# Patient Record
Sex: Female | Born: 1950 | Race: White | Hispanic: No | Marital: Single | Smoking: Never smoker
Health system: Southern US, Community
[De-identification: ages and names within clinical notes are randomized; demographics above are authoritative.]

---

## 1998-05-17 ENCOUNTER — Other Ambulatory Visit: Admission: RE | Admit: 1998-05-17 | Discharge: 1998-05-17 | Payer: Self-pay | Admitting: Obstetrics and Gynecology

## 1998-10-28 ENCOUNTER — Other Ambulatory Visit: Admission: RE | Admit: 1998-10-28 | Discharge: 1998-10-28 | Payer: Self-pay | Admitting: Obstetrics and Gynecology

## 1999-06-29 ENCOUNTER — Other Ambulatory Visit: Admission: RE | Admit: 1999-06-29 | Discharge: 1999-06-29 | Payer: Self-pay | Admitting: *Deleted

## 2000-07-01 ENCOUNTER — Other Ambulatory Visit: Admission: RE | Admit: 2000-07-01 | Discharge: 2000-07-01 | Payer: Self-pay | Admitting: *Deleted

## 2001-05-05 ENCOUNTER — Other Ambulatory Visit: Admission: RE | Admit: 2001-05-05 | Discharge: 2001-05-05 | Payer: Self-pay | Admitting: Internal Medicine

## 2001-05-05 ENCOUNTER — Encounter: Admission: RE | Admit: 2001-05-05 | Discharge: 2001-05-05 | Payer: Self-pay | Admitting: Internal Medicine

## 2001-05-05 ENCOUNTER — Encounter: Payer: Self-pay | Admitting: Internal Medicine

## 2002-07-28 ENCOUNTER — Other Ambulatory Visit: Admission: RE | Admit: 2002-07-28 | Discharge: 2002-07-28 | Payer: Self-pay | Admitting: Internal Medicine

## 2003-03-24 ENCOUNTER — Encounter: Admission: RE | Admit: 2003-03-24 | Discharge: 2003-03-24 | Payer: Self-pay | Admitting: Internal Medicine

## 2004-11-27 ENCOUNTER — Other Ambulatory Visit: Admission: RE | Admit: 2004-11-27 | Discharge: 2004-11-27 | Payer: Self-pay | Admitting: Internal Medicine

## 2005-06-29 IMAGING — CR DG CERVICAL SPINE COMPLETE 4+V
5 series · 5 of 5 positions shown · non-contrast
Comparison: none

CLINICAL DATA: Neck pain.

[view not recorded (1 of 5)]
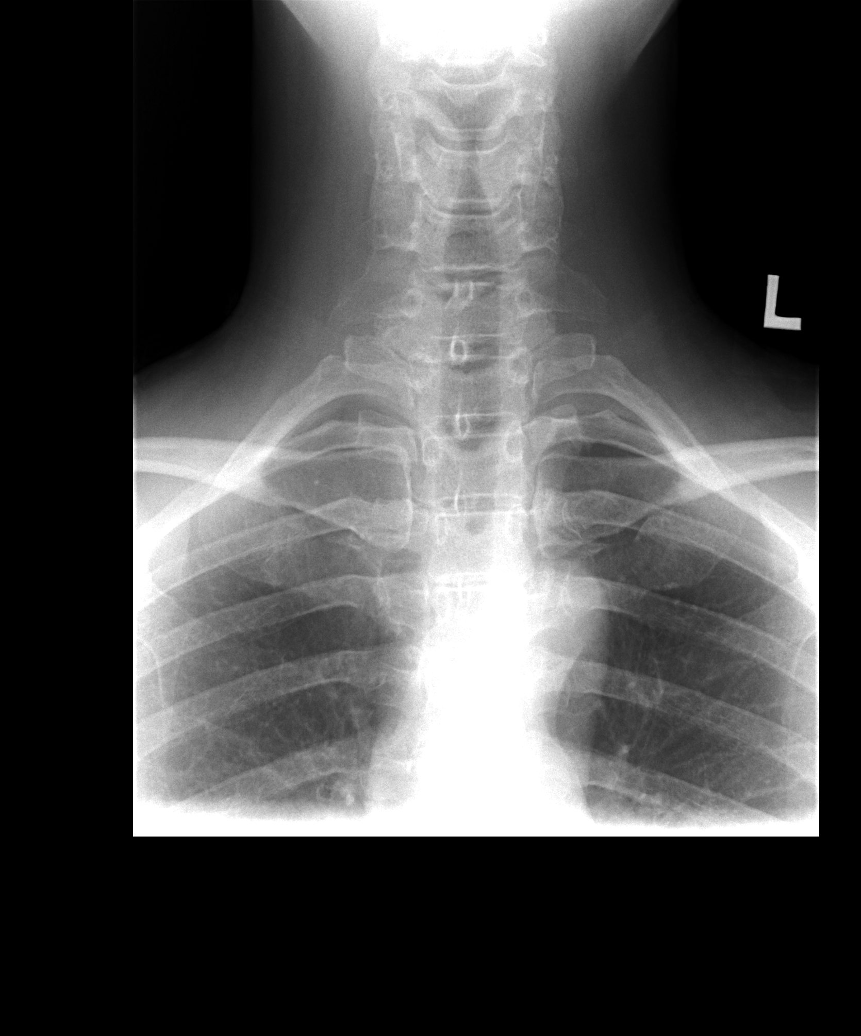

[view not recorded (2 of 5)]
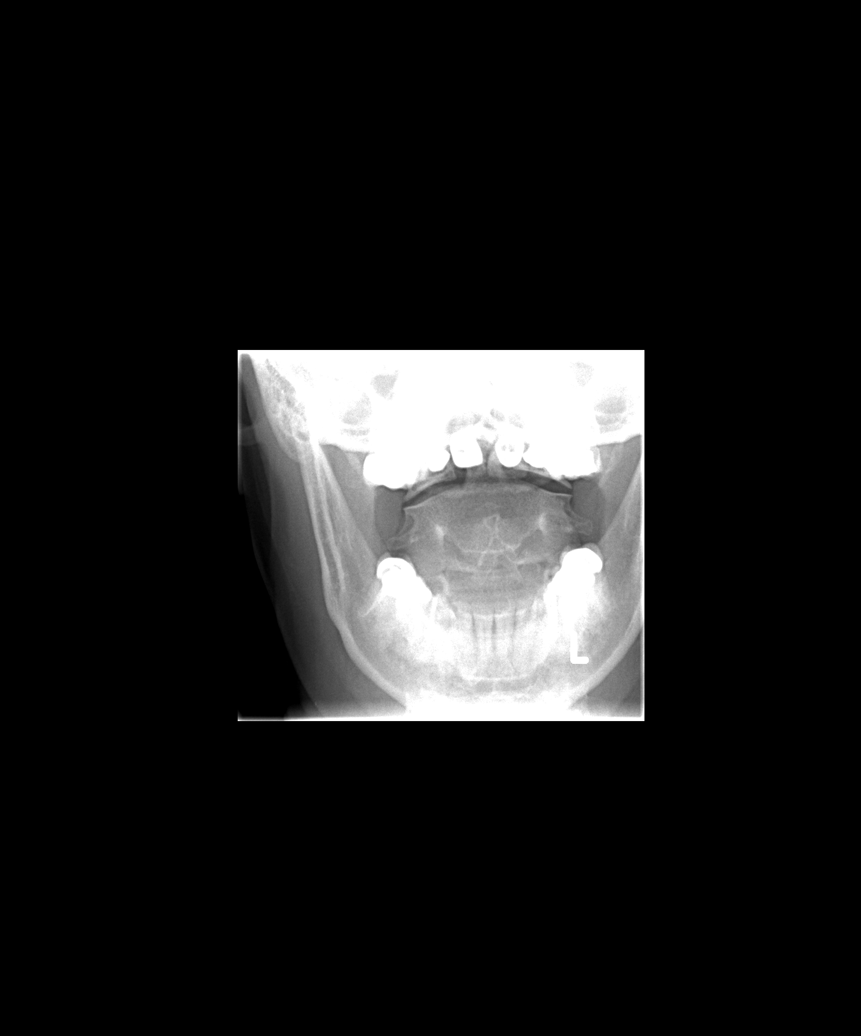

[view not recorded (3 of 5)]
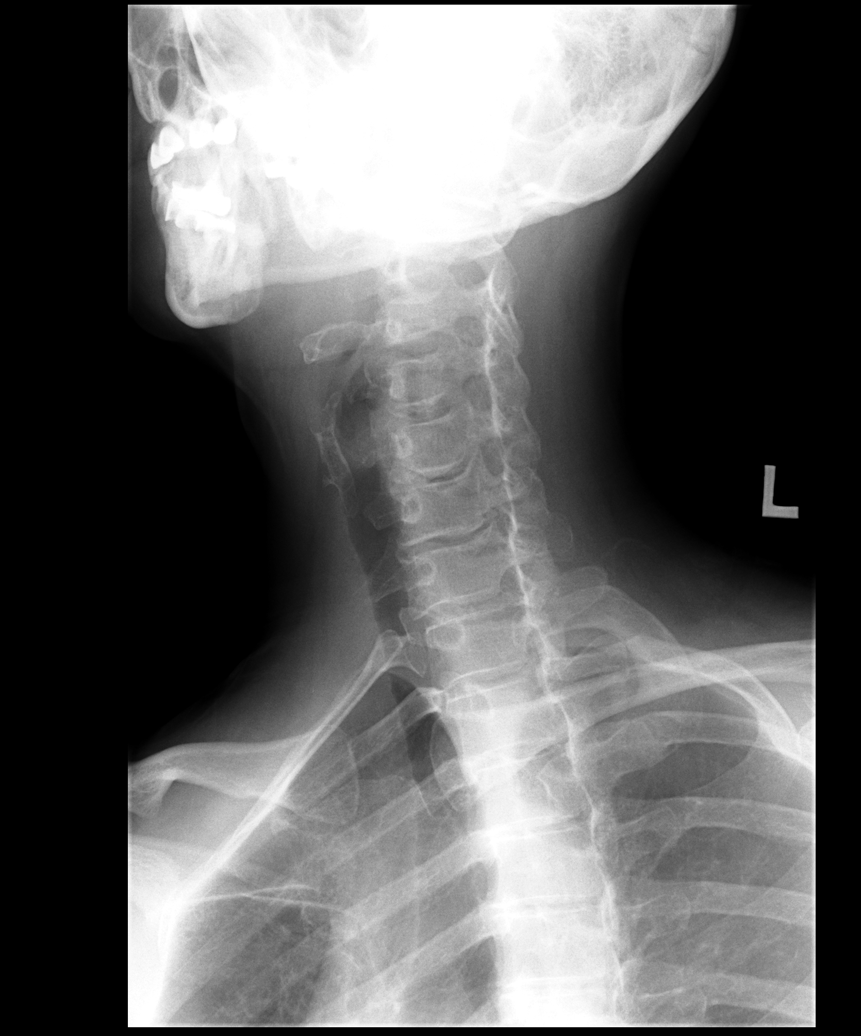

[view not recorded (4 of 5)]
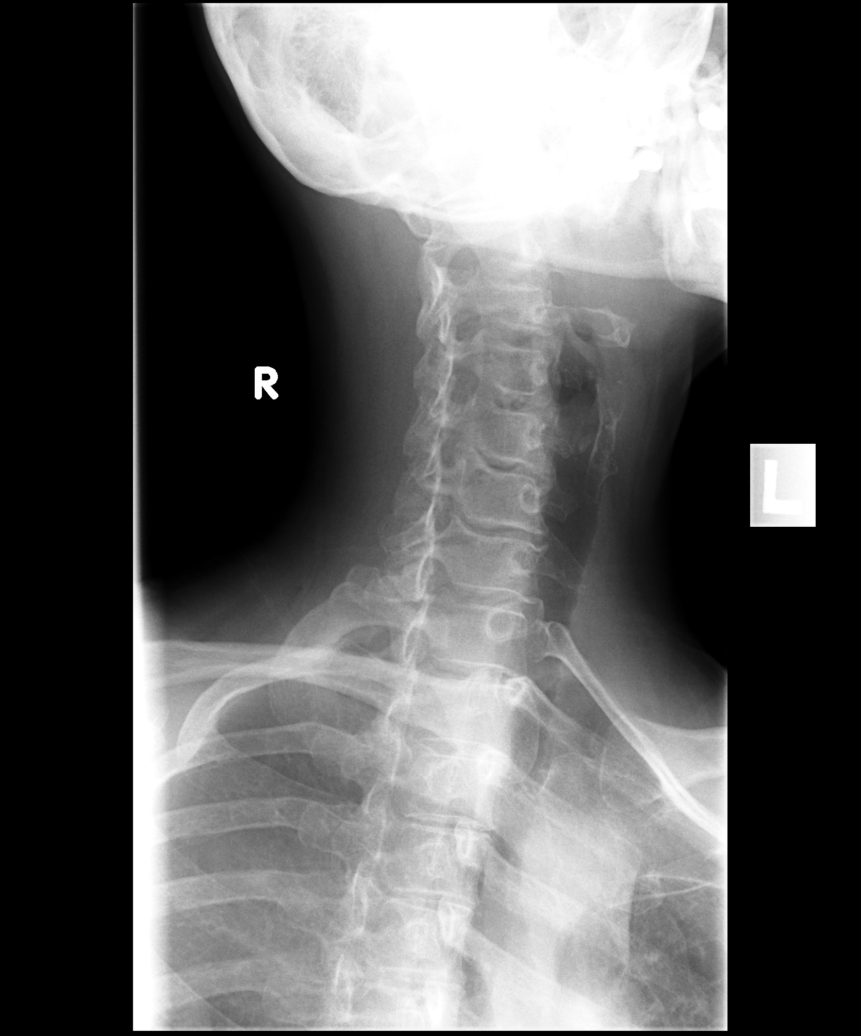

[view not recorded (5 of 5)]
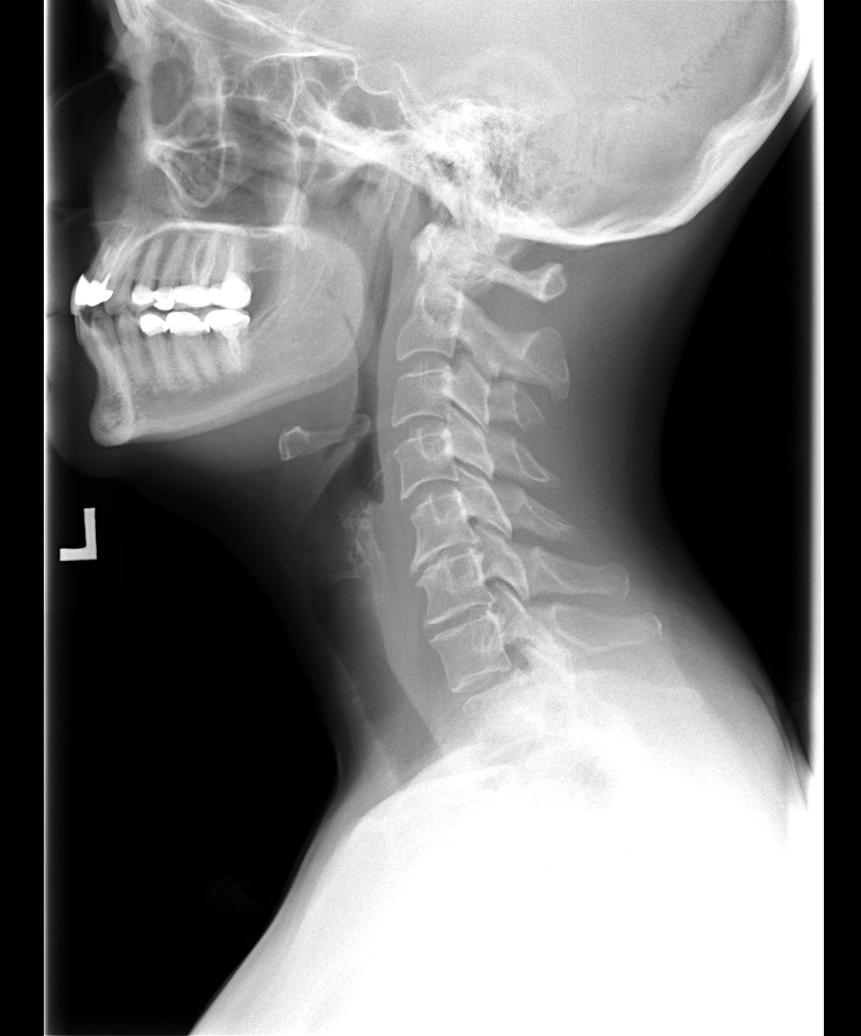

[5 of 5 positions shown; findings below may reference images not displayed]

CERVICAL SPINE COMPLETE ? FIVE VIEWS
 There is advanced degenerative disk disease at C5-6 and C6-7 with disk space and osteophyte formation. Oblique views show significant foraminal narrowing at C5-6, right greater than left and C6-7, left greater than right.  The odontoid views are unremarkable.
 IMPRESSION
 Advanced two level degenerative change C5-6 and C6-7 as described.

## 2006-03-05 ENCOUNTER — Other Ambulatory Visit: Admission: RE | Admit: 2006-03-05 | Discharge: 2006-03-05 | Payer: Self-pay | Admitting: Internal Medicine

## 2007-12-23 ENCOUNTER — Ambulatory Visit: Payer: Self-pay | Admitting: Internal Medicine

## 2008-02-03 ENCOUNTER — Ambulatory Visit: Payer: Self-pay | Admitting: Internal Medicine

## 2008-05-20 ENCOUNTER — Ambulatory Visit: Payer: Self-pay | Admitting: Internal Medicine

## 2009-08-18 ENCOUNTER — Ambulatory Visit: Payer: Self-pay | Admitting: Internal Medicine

## 2009-09-22 ENCOUNTER — Ambulatory Visit: Payer: Self-pay | Admitting: Internal Medicine

## 2010-03-11 ENCOUNTER — Encounter: Payer: Self-pay | Admitting: Internal Medicine

## 2010-04-06 ENCOUNTER — Ambulatory Visit (INDEPENDENT_AMBULATORY_CARE_PROVIDER_SITE_OTHER): Payer: PRIVATE HEALTH INSURANCE | Admitting: Internal Medicine

## 2010-04-06 DIAGNOSIS — R61 Generalized hyperhidrosis: Secondary | ICD-10-CM

## 2010-04-06 DIAGNOSIS — J329 Chronic sinusitis, unspecified: Secondary | ICD-10-CM

## 2010-04-06 DIAGNOSIS — G43719 Chronic migraine without aura, intractable, without status migrainosus: Secondary | ICD-10-CM

## 2011-05-24 ENCOUNTER — Encounter: Payer: Self-pay | Admitting: Internal Medicine

## 2011-06-25 ENCOUNTER — Other Ambulatory Visit: Payer: Self-pay | Admitting: Internal Medicine

## 2011-08-27 ENCOUNTER — Encounter: Payer: Self-pay | Admitting: Internal Medicine

## 2011-08-27 ENCOUNTER — Ambulatory Visit (INDEPENDENT_AMBULATORY_CARE_PROVIDER_SITE_OTHER): Payer: BC Managed Care – PPO | Admitting: Internal Medicine

## 2011-08-27 VITALS — BP 118/84 | HR 76 | Temp 98.4°F | Ht 63.0 in | Wt 144.0 lb

## 2011-08-27 DIAGNOSIS — F419 Anxiety disorder, unspecified: Secondary | ICD-10-CM

## 2011-08-27 DIAGNOSIS — F411 Generalized anxiety disorder: Secondary | ICD-10-CM

## 2011-08-27 DIAGNOSIS — Z8669 Personal history of other diseases of the nervous system and sense organs: Secondary | ICD-10-CM

## 2011-08-27 DIAGNOSIS — M76899 Other specified enthesopathies of unspecified lower limb, excluding foot: Secondary | ICD-10-CM

## 2011-08-27 DIAGNOSIS — M7061 Trochanteric bursitis, right hip: Secondary | ICD-10-CM

## 2011-08-28 DIAGNOSIS — F419 Anxiety disorder, unspecified: Secondary | ICD-10-CM | POA: Insufficient documentation

## 2011-08-28 DIAGNOSIS — Z8669 Personal history of other diseases of the nervous system and sense organs: Secondary | ICD-10-CM | POA: Insufficient documentation

## 2011-08-28 NOTE — Progress Notes (Signed)
  Subjective:    Patient ID: Roberta Peters, female    DOB: 04/03/1950, 61 y.o.   MRN: [id number]  HPI 61 year old white female currently living in [place], [place]. but looking to buy a home here as well in today complaining of right hip pain which is been going on for a number of months. She's been seeing a [facility] in [place]. She had recent MRI of hip which was done back in [facility] of [place] and ordered by chiropractor. 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 No evidence of avascular necrosis. Mild degeneration noted of the acetabular labrum in both hips. Mild bursitis and tendinitis over the greater trochanter of the right hip. Soft tissues of the hip were normal. This study was done on 08/15/2011 . Also, History of migraine headaches. History of anxiety surrounding loss of husband due to kidney cancer as well as dealing with her relatives.    Review of Systems     Objective:   Physical Exam  some pain with internal rotation of right hip. Straight leg raising is negative at 90. Deep tendon reflexes are 2+ and symmetrical bilaterally. Muscle strength in the lower extremities is 5 over 5 in all groups tested. Tender over trochanter. Chest clear to auscultation. Cardiac exam regular rate and rhythm normal S1 and S2. Slightly anxious.        Assessment & Plan:  Trochanteric bursitis-right hip  Migraine headaches  Anxiety  Plan: Refill Frova when necessary one year for migraine headaches. Sterapred DS 10 mg 6 day dosepak for trochanteric bursitis. May need to have physical therapy for bursitis. Refill Xanax 0.25 mg (#60) 1 by mouth twice a day as needed for anxiety with 1 refill.  Addendum: 09/10/2011 received note from patient that she is feeling much better and is pain free. This is after Sterapred dosepak.

## 2011-09-06 ENCOUNTER — Telehealth: Payer: Self-pay

## 2011-09-06 NOTE — Patient Instructions (Addendum)
Take Frova as needed for migraine headaches. Take Sterapred DS 10 mg 6 day dosepak for bursitis and hip. Take Xanax sparingly for anxiety with relatives.

## 2011-09-06 NOTE — Telephone Encounter (Signed)
You gave her a 12 day prednisone dosepak for hip pain, and she is on day 7. Started feeling a scratchy throat yesterday, but no worse today. Hip feels great, but she is worried about an allergic reaction to Prednisone. No other symptoms. Advised to take Claritin or Benadryl.

## 2011-09-06 NOTE — Telephone Encounter (Signed)
Sounds like she is coming down with something.  Call back if symptoms persist or change.

## 2011-09-07 NOTE — Telephone Encounter (Signed)
Patient advised.

## 2011-11-29 ENCOUNTER — Ambulatory Visit (INDEPENDENT_AMBULATORY_CARE_PROVIDER_SITE_OTHER): Payer: BC Managed Care – PPO | Admitting: Internal Medicine

## 2011-11-29 DIAGNOSIS — Z23 Encounter for immunization: Secondary | ICD-10-CM

## 2011-11-29 NOTE — Patient Instructions (Addendum)
Rx given for Zostavax vaccine to be done at pharmacy

## 2011-11-30 DIAGNOSIS — Z23 Encounter for immunization: Secondary | ICD-10-CM

## 2012-01-10 LAB — HM COLONOSCOPY

## 2012-02-18 ENCOUNTER — Other Ambulatory Visit: Payer: Self-pay | Admitting: Internal Medicine

## 2012-02-18 NOTE — Telephone Encounter (Signed)
Please check on this refill. Thought she had several refills on last RX

## 2012-10-09 ENCOUNTER — Ambulatory Visit (INDEPENDENT_AMBULATORY_CARE_PROVIDER_SITE_OTHER): Payer: BC Managed Care – PPO | Admitting: Internal Medicine

## 2012-10-09 ENCOUNTER — Other Ambulatory Visit: Payer: BC Managed Care – PPO | Admitting: Internal Medicine

## 2012-10-09 ENCOUNTER — Other Ambulatory Visit (HOSPITAL_COMMUNITY)
Admission: RE | Admit: 2012-10-09 | Discharge: 2012-10-09 | Disposition: A | Discharge status: Home / Self Care | Payer: BC Managed Care – PPO | Source: Ambulatory Visit | Attending: Internal Medicine | Admitting: Internal Medicine

## 2012-10-09 ENCOUNTER — Encounter: Payer: Self-pay | Admitting: Internal Medicine

## 2012-10-09 VITALS — BP 136/90 | HR 68 | Ht 62.25 in | Wt 148.0 lb

## 2012-10-09 DIAGNOSIS — Z124 Encounter for screening for malignant neoplasm of cervix: Secondary | ICD-10-CM | POA: Insufficient documentation

## 2012-10-09 DIAGNOSIS — Z130 Encounter for screening for diseases of the blood and blood-forming organs and certain disorders involving the immune mechanism: Secondary | ICD-10-CM

## 2012-10-09 DIAGNOSIS — Z13220 Encounter for screening for lipoid disorders: Secondary | ICD-10-CM

## 2012-10-09 DIAGNOSIS — E7800 Pure hypercholesterolemia, unspecified: Secondary | ICD-10-CM

## 2012-10-09 DIAGNOSIS — M7061 Trochanteric bursitis, right hip: Secondary | ICD-10-CM

## 2012-10-09 DIAGNOSIS — M76899 Other specified enthesopathies of unspecified lower limb, excluding foot: Secondary | ICD-10-CM

## 2012-10-09 DIAGNOSIS — F411 Generalized anxiety disorder: Secondary | ICD-10-CM

## 2012-10-09 DIAGNOSIS — Z0000 Encounter for general adult medical examination without abnormal findings: Secondary | ICD-10-CM

## 2012-10-09 DIAGNOSIS — Z23 Encounter for immunization: Secondary | ICD-10-CM

## 2012-10-09 DIAGNOSIS — M899 Disorder of bone, unspecified: Secondary | ICD-10-CM

## 2012-10-09 DIAGNOSIS — Z8669 Personal history of other diseases of the nervous system and sense organs: Secondary | ICD-10-CM

## 2012-10-09 DIAGNOSIS — Z1329 Encounter for screening for other suspected endocrine disorder: Secondary | ICD-10-CM

## 2012-10-09 DIAGNOSIS — M8580 Other specified disorders of bone density and structure, unspecified site: Secondary | ICD-10-CM

## 2012-10-09 LAB — COMPREHENSIVE METABOLIC PANEL
ALT: 21 U/L (ref 0–35)
AST: 21 U/L (ref 0–37)
Albumin: 4.3 g/dL (ref 3.5–5.2)
Alkaline Phosphatase: 64 U/L (ref 39–117)
BUN: 16 mg/dL (ref 6–23)
CO2: 28 mEq/L (ref 19–32)
Calcium: 9.7 mg/dL (ref 8.4–10.5)
Chloride: 105 mEq/L (ref 96–112)
Creat: 0.57 mg/dL (ref 0.50–1.10)
Glucose, Bld: 89 mg/dL (ref 70–99)
Potassium: 4.1 mEq/L (ref 3.5–5.3)
Sodium: 141 mEq/L (ref 135–145)
Total Bilirubin: 0.8 mg/dL (ref 0.3–1.2)
Total Protein: 7 g/dL (ref 6.0–8.3)

## 2012-10-09 LAB — CBC WITH DIFFERENTIAL/PLATELET
Basophils Absolute: 0 10*3/uL (ref 0.0–0.1)
Basophils Relative: 1 % (ref 0–1)
Eosinophils Absolute: 0 10*3/uL (ref 0.0–0.7)
Eosinophils Relative: 1 % (ref 0–5)
HCT: 40 % (ref 36.0–46.0)
Hemoglobin: 13.6 g/dL (ref 12.0–15.0)
Lymphocytes Relative: 40 % (ref 12–46)
Lymphs Abs: 1.9 10*3/uL (ref 0.7–4.0)
MCH: 30.6 pg (ref 26.0–34.0)
MCHC: 34 g/dL (ref 30.0–36.0)
MCV: 89.9 fL (ref 78.0–100.0)
Monocytes Absolute: 0.4 10*3/uL (ref 0.1–1.0)
Monocytes Relative: 7 % (ref 3–12)
Neutro Abs: 2.5 10*3/uL (ref 1.7–7.7)
Neutrophils Relative %: 51 % (ref 43–77)
Platelets: 224 10*3/uL (ref 150–400)
RBC: 4.45 MIL/uL (ref 3.87–5.11)
RDW: 13.4 % (ref 11.5–15.5)
WBC: 4.9 10*3/uL (ref 4.0–10.5)

## 2012-10-09 LAB — POCT URINALYSIS DIPSTICK
Bilirubin, UA: NEGATIVE
Blood, UA: NEGATIVE
Glucose, UA: NEGATIVE
Ketones, UA: NEGATIVE
Leukocytes, UA: NEGATIVE
Nitrite, UA: NEGATIVE
Protein, UA: NEGATIVE
Spec Grav, UA: 1.01
Urobilinogen, UA: NEGATIVE
pH, UA: 5.5

## 2012-10-09 LAB — LIPID PANEL
Cholesterol: 225 mg/dL — ABNORMAL HIGH (ref 0–200)
HDL: 83 mg/dL (ref >39)
LDL Cholesterol: 123 mg/dL — ABNORMAL HIGH (ref 0–99)
Total CHOL/HDL Ratio: 2.7 Ratio
Triglycerides: 93 mg/dL (ref <150)
VLDL: 19 mg/dL (ref 0–40)

## 2012-10-09 LAB — TSH: TSH: 1.903 u[IU]/mL (ref 0.350–4.500)

## 2012-10-10 LAB — VITAMIN D 25 HYDROXY (VIT D DEFICIENCY, FRACTURES): Vit D, 25-Hydroxy: 41 ng/mL (ref 30–89)

## 2012-10-10 NOTE — Progress Notes (Signed)
Patient informed. 

## 2012-10-12 DIAGNOSIS — M7061 Trochanteric bursitis, right hip: Secondary | ICD-10-CM | POA: Insufficient documentation

## 2012-10-12 DIAGNOSIS — M8580 Other specified disorders of bone density and structure, unspecified site: Secondary | ICD-10-CM | POA: Insufficient documentation

## 2012-10-12 DIAGNOSIS — E7800 Pure hypercholesterolemia, unspecified: Secondary | ICD-10-CM | POA: Insufficient documentation

## 2012-10-12 NOTE — Patient Instructions (Addendum)
Return in one year or as needed. Have mammogram and bone density study

## 2012-10-12 NOTE — Progress Notes (Signed)
Subjective:    Patient ID: Roberta Peters, female    DOB: 09-24-1950, 62 y.o.   MRN: [id number]  HPI  62 year old white female who spends her time both in [place] and as well [place], [place] where she owns a home for health maintenance and evaluation of medical problems. History of right hip pain dating back to 12-Jul-2011. She had an MRI of the hip done in [place] which was ordered by chiropractor. 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 There was mild degeneration noted of the acetabular labrum in both hips. Mild bursitis and tendinitis over the greater trochanter of the right hip. I thought at the time she had right trochanteric bursitis and treated her with a short course of prednisone which did help. Hip pain has returned.  She also has a history of migraine headaches.  History of mild anxiety, history of osteopenia, history of HSV type I, history of GE reflux. History of C6-C7 radiculopathy in 07-12-03. Had tetanus immunization March 2003.  Patient has no history of serious illnesses accidents or operations. No known drug allergies. Total of 3 pregnancies and one miscarriage.  Social history: She is a widow. Husband died from kidney cancer in 07-11-05 and owned his own company, [employer]. He was a Teacher, music. Her 2 sons are now working in [employer]. 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 Does not smoke. Social alcohol consumption.  Family history: 2 adult sons. Oldest son recently engaged. Youngest son is married and has 2 children. Sons live here in [place].  2 sisters. One sister with history of epilepsy. Parents are deceased. Father had dementia and kidney stones. Mother had heart problems of arrhythmia.  At one point was tried on bisphosphonates for osteopenia but that caused  GE reflux.  Had colonoscopy in [place] ,[place]  In 2011/07/12. Patient was told result were normal and she could have 10 year followup. She says she will not have the study again.    Review of Systems  Constitutional: Negative.   HENT: Negative.   Eyes: Negative.   Respiratory: Negative.   Cardiovascular: Negative.   Gastrointestinal: Negative.   Endocrine: Negative.   Genitourinary: Negative.   Musculoskeletal:       Recurrence of right hip pain  Allergic/Immunologic: Negative.   Neurological:       History of migraine headaches  Hematological: Negative.   Psychiatric/Behavioral: Negative.        Objective:   Physical Exam  Vitals reviewed. Constitutional: She is oriented to person, place, and time. She appears well-developed and well-nourished. No distress.  HENT:  Head: Normocephalic and atraumatic.  Right Ear: External ear normal.  Left Ear: External ear normal.  Nose: Nose normal.  Mouth/Throat: Oropharynx is clear and moist. No oropharyngeal exudate.  Eyes: Conjunctivae are normal. Right eye exhibits no discharge. Left eye exhibits no discharge. No scleral icterus.  Neck: Neck supple. No JVD present. No thyromegaly present.  Cardiovascular: Normal rate, regular rhythm, normal heart sounds and intact distal pulses.   No murmur heard. Pulmonary/Chest: Effort normal and breath sounds normal. She has no wheezes. She has no rales.  Abdominal: Soft. Bowel sounds are normal. She exhibits no distension and no mass. There is no tenderness. There is no rebound and no guarding.  Genitourinary:  Pap taken no masses on  bimanual exam  Musculoskeletal: Normal range of motion. She exhibits no edema.  Tender over her right trochanter consistent with bursitis  Lymphadenopathy:    She has no cervical adenopathy.  Neurological: She is alert and oriented to person, place, and time. She has normal reflexes. She displays normal reflexes. No cranial nerve deficit. She exhibits normal muscle  tone. Coordination normal.  Skin: Skin is warm and dry. No rash noted. She is not diaphoretic.  Psychiatric: She has a normal mood and affect. Her behavior is normal. Judgment and thought content normal.          Assessment & Plan:  History of migraine headaches  Osteopenia-treated with vitamin D and calcium supplements  History of mild anxiety treated with Xanax sparingly  Right hip pain consistent with right trochanteric bursitis. He prescription for Sterapred DS 10 mg 6 day dosepak.  Elevated LDL cholesterol-watch diet  Plan: Return in one year or as needed. Have mammogram and bone density study on yearly basis.

## 2013-07-15 ENCOUNTER — Encounter: Payer: Self-pay | Admitting: Internal Medicine

## 2013-07-21 ENCOUNTER — Ambulatory Visit (INDEPENDENT_AMBULATORY_CARE_PROVIDER_SITE_OTHER): Payer: BC Managed Care – PPO | Admitting: Internal Medicine

## 2013-07-21 ENCOUNTER — Encounter: Payer: Self-pay | Admitting: Internal Medicine

## 2013-07-21 VITALS — BP 134/86 | HR 72 | Temp 98.4°F | Ht 63.0 in | Wt 146.0 lb

## 2013-07-21 DIAGNOSIS — H8110 Benign paroxysmal vertigo, unspecified ear: Secondary | ICD-10-CM

## 2013-07-21 MED ORDER — ALPRAZOLAM 0.5 MG PO TABS: ORAL_TABLET | ORAL | Status: DC

## 2013-07-21 MED ORDER — PREDNISONE 10 MG PO KIT: PACK | ORAL | Status: DC

## 2013-07-21 MED ORDER — MECLIZINE HCL 25 MG PO TABS: 25.0000 mg | ORAL_TABLET | Freq: Three times a day (TID) | ORAL | Status: DC

## 2013-07-21 NOTE — Progress Notes (Signed)
   Subjective:    Patient ID: Roberta Peters, female    DOB: 1950-06-29, 63 y.o.   MRN: [id number]  HPI Had sinus infection a couple of months ago.  Developed vertigo about 6 weeks ago. Slowly improvement with vertigo but not 100 % better. No discolored nasal drainage. Has clear nasal drainage. No cough. No fever.    Review of Systems     Objective:   Physical Exam  TMs are full bilaterally but not red. Pharynx is clear. She sounds nasally congested. Neck is supple without adenopathy. Chest clear to auscultation.        Assessment & Plan:  History of benign positional vertigo-resolving. This is recurrent.  Anxiety-takes Xanax sparingly  Allergic rhinitis  Bilateral serous otitis media  Plan: Sterapred DS 10 mg 6 day dosepak take as corrected. Refill Xanax at patient request for anxiety. Refill antevert for benign positional vertigo.  25 minutes spent with patient

## 2013-07-21 NOTE — Patient Instructions (Addendum)
Take Antivert for vertigo. Take Xanax for anxiety. Take Prednisone for allergic rhinitis. Call if symptoms persist.

## 2013-07-27 MED ORDER — PREDNISONE 10 MG PO KIT: PACK | ORAL | Status: DC

## 2013-07-27 NOTE — Addendum Note (Signed)
Addended by: Judy Pimple on: 07/27/2013 04:40 PM   Modules accepted: Orders

## 2013-11-17 ENCOUNTER — Encounter: Payer: Self-pay | Admitting: Internal Medicine

## 2013-11-17 ENCOUNTER — Ambulatory Visit (INDEPENDENT_AMBULATORY_CARE_PROVIDER_SITE_OTHER): Payer: BC Managed Care – PPO | Admitting: Internal Medicine

## 2013-11-17 VITALS — BP 130/98 | HR 68 | Temp 98.4°F | Resp 14 | Ht 63.0 in | Wt 146.0 lb

## 2013-11-17 DIAGNOSIS — Z8669 Personal history of other diseases of the nervous system and sense organs: Secondary | ICD-10-CM

## 2013-11-17 DIAGNOSIS — H6500 Acute serous otitis media, unspecified ear: Secondary | ICD-10-CM

## 2013-11-17 DIAGNOSIS — H8110 Benign paroxysmal vertigo, unspecified ear: Secondary | ICD-10-CM

## 2013-11-17 DIAGNOSIS — H6506 Acute serous otitis media, recurrent, bilateral: Secondary | ICD-10-CM

## 2013-11-17 NOTE — Progress Notes (Signed)
   Subjective:    Patient ID: Clarice PoleNancy G Jacques, female    DOB: 06-02-1950, 63 y.o.   MRN: [id number]  HPI Patient in today with recurrent dizziness with position change and ear congestion. Thinks it may have an allergy component but has never been allergy tested. Seems to respond to prednisone. Has taken meclizine but doesn't want to take that on a regular basis. Has had this issue now for couple of months which seems a bit long. She has had this previously as well. No fever. No headache. No visual changes. No tinnitus.  We talked about doing an MRI of her brain since she has had several bouts of vertigo. She declines at this point in time. Declines allergy testing.    Review of Systems     Objective:   Physical Exam Skin warm and dry. Nodes none. TMs are full bilaterally but not red. Neck is supple. Chest clear.       Assessment & Plan:  Benign positional vertigo  Bilateral serous otitis media  Plan: Sterapred DS 10 mg 6 day dosepak. Zithromax Z-PAK take hysterectomy with one refill. If symptoms recur, she must consider MRI of the brain, possible neurological consultation for vertigo, possible allergy testing

## 2013-11-17 NOTE — Patient Instructions (Signed)
Take prednisone and Zithromax as prescribed. If symptoms recur, consider MRI of the brain, neurological evaluation and possible allergy testing.

## 2013-12-04 ENCOUNTER — Encounter: Payer: Self-pay | Admitting: Internal Medicine

## 2013-12-07 ENCOUNTER — Encounter: Payer: Self-pay | Admitting: Internal Medicine

## 2014-07-13 ENCOUNTER — Encounter: Payer: Self-pay | Admitting: Internal Medicine

## 2014-09-13 ENCOUNTER — Encounter: Payer: Self-pay | Admitting: Internal Medicine

## 2014-09-13 ENCOUNTER — Other Ambulatory Visit: Payer: Medicare Other | Admitting: Internal Medicine

## 2014-09-13 ENCOUNTER — Ambulatory Visit (INDEPENDENT_AMBULATORY_CARE_PROVIDER_SITE_OTHER): Payer: Medicare Other | Admitting: Internal Medicine

## 2014-09-13 VITALS — BP 110/78 | HR 70 | Temp 98.0°F | Ht 63.0 in | Wt 146.5 lb

## 2014-09-13 DIAGNOSIS — E7800 Pure hypercholesterolemia, unspecified: Secondary | ICD-10-CM

## 2014-09-13 DIAGNOSIS — E559 Vitamin D deficiency, unspecified: Secondary | ICD-10-CM

## 2014-09-13 DIAGNOSIS — M129 Arthropathy, unspecified: Secondary | ICD-10-CM

## 2014-09-13 DIAGNOSIS — F411 Generalized anxiety disorder: Secondary | ICD-10-CM | POA: Diagnosis not present

## 2014-09-13 DIAGNOSIS — Z0000 Encounter for general adult medical examination without abnormal findings: Secondary | ICD-10-CM | POA: Diagnosis not present

## 2014-09-13 DIAGNOSIS — Z23 Encounter for immunization: Secondary | ICD-10-CM

## 2014-09-13 DIAGNOSIS — R5383 Other fatigue: Secondary | ICD-10-CM

## 2014-09-13 DIAGNOSIS — Z8669 Personal history of other diseases of the nervous system and sense organs: Secondary | ICD-10-CM | POA: Diagnosis not present

## 2014-09-13 DIAGNOSIS — M8580 Other specified disorders of bone density and structure, unspecified site: Secondary | ICD-10-CM | POA: Diagnosis not present

## 2014-09-13 DIAGNOSIS — M160 Bilateral primary osteoarthritis of hip: Secondary | ICD-10-CM

## 2014-09-13 DIAGNOSIS — Z79899 Other long term (current) drug therapy: Secondary | ICD-10-CM

## 2014-09-13 DIAGNOSIS — G4700 Insomnia, unspecified: Secondary | ICD-10-CM | POA: Insufficient documentation

## 2014-09-13 LAB — CBC WITH DIFFERENTIAL/PLATELET
Basophils Absolute: 0 10*3/uL (ref 0.0–0.1)
Basophils Relative: 1 % (ref 0–1)
Eosinophils Absolute: 0.1 10*3/uL (ref 0.0–0.7)
Eosinophils Relative: 2 % (ref 0–5)
HCT: 42.3 % (ref 36.0–46.0)
Hemoglobin: 14.1 g/dL (ref 12.0–15.0)
Lymphocytes Relative: 43 % (ref 12–46)
Lymphs Abs: 1.9 10*3/uL (ref 0.7–4.0)
MCH: 30.1 pg (ref 26.0–34.0)
MCHC: 33.3 g/dL (ref 30.0–36.0)
MCV: 90.2 fL (ref 78.0–100.0)
MPV: 10.2 fL (ref 8.6–12.4)
Monocytes Absolute: 0.3 10*3/uL (ref 0.1–1.0)
Monocytes Relative: 7 % (ref 3–12)
Neutro Abs: 2.1 10*3/uL (ref 1.7–7.7)
Neutrophils Relative %: 47 % (ref 43–77)
Platelets: 218 10*3/uL (ref 150–400)
RBC: 4.69 MIL/uL (ref 3.87–5.11)
RDW: 13.4 % (ref 11.5–15.5)
WBC: 4.5 10*3/uL (ref 4.0–10.5)

## 2014-09-13 LAB — POCT URINALYSIS DIPSTICK
Bilirubin, UA: NEGATIVE
Blood, UA: NEGATIVE
Glucose, UA: NEGATIVE
Ketones, UA: NEGATIVE
Leukocytes, UA: NEGATIVE
Nitrite, UA: NEGATIVE
Protein, UA: NEGATIVE
Spec Grav, UA: 1.01
Urobilinogen, UA: NEGATIVE
pH, UA: 6

## 2014-09-13 LAB — COMPLETE METABOLIC PANEL WITHOUT GFR
ALT: 22 U/L (ref 6–29)
AST: 24 U/L (ref 10–35)
Albumin: 4.6 g/dL (ref 3.6–5.1)
Alkaline Phosphatase: 60 U/L (ref 33–130)
BUN: 17 mg/dL (ref 7–25)
CO2: 26 mmol/L (ref 20–31)
Calcium: 9.6 mg/dL (ref 8.6–10.4)
Chloride: 104 mmol/L (ref 98–110)
Creat: 0.64 mg/dL (ref 0.50–0.99)
GFR, Est African American: >89 mL/min
GFR, Est Non African American: >89 mL/min
Glucose, Bld: 90 mg/dL (ref 65–99)
Potassium: 4.8 mmol/L (ref 3.5–5.3)
Sodium: 140 mmol/L (ref 135–146)
Total Bilirubin: 0.8 mg/dL (ref 0.2–1.2)
Total Protein: 7.2 g/dL (ref 6.1–8.1)

## 2014-09-13 LAB — LIPID PANEL
Cholesterol: 216 mg/dL — ABNORMAL HIGH (ref 125–200)
HDL: 94 mg/dL
LDL Cholesterol: 97 mg/dL
Total CHOL/HDL Ratio: 2.3 ratio
Triglycerides: 124 mg/dL
VLDL: 25 mg/dL

## 2014-09-13 LAB — TSH: TSH: 2.338 u[IU]/mL (ref 0.350–4.500)

## 2014-09-13 MED ORDER — SUMATRIPTAN SUCCINATE 50 MG PO TABS: ORAL_TABLET | ORAL | Status: DC

## 2014-09-13 MED ORDER — ALPRAZOLAM 0.5 MG PO TABS
ORAL_TABLET | ORAL | Status: DC
Start: 2014-09-13 — End: 2015-11-03

## 2014-09-13 NOTE — Patient Instructions (Addendum)
Return in one year or as needed. Xanax and Imitrex refilled. Prevnar given. To have bone density study next year.

## 2014-09-13 NOTE — Progress Notes (Addendum)
Subjective:    Patient ID: Roberta Peters, female    DOB: 05/11/1950, 64 y.o.   MRN: [id number]  HPI 64 year old Female in today for Welcome to Medicare physical examination. History of osteoarthritis both hips and SI joints without evidence of avascular necrosis based on MRI done in [place] July 2013. Also had mild bursitis and tendinitis over the greater trochanter of the right hip. She was treated with a short course of prednisone for right trochanteric bursitis and did well.  History of migraine headaches for which she uses triptan medication.  History of mild anxiety, insomnia, osteopenia, history of HSV type I, history of GE reflux, history of C6-C7 radiculopathy in 07-27-2003. Had tetanus immunization March 2003.  No history of serious illnesses accidents or operations. No known drug allergies. Total of 3 pregnancies and one miscarriage.  At one point she was tried on bisphosphonates for osteopenia but that caused GE reflux.  Bone density study last done 2 years ago.  She had colonoscopy in [place] well [place] in 07-27-2011 and was given 10 year follow-up and was told it was normal.  Social history: She is a widow. Husband died from kidney cancer in 2005/07/26. He was CEO of [employer]. He was a motivational speak or. Her 2 sons are now working with accompany. 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 Does not smoke. Social alcohol consumption. She divides her time between [place].  Family history: 2 adult sons. Youngest son is married and has 2 children. 2 sisters alive and well. One sister with history of epilepsy which is stable. Parents are deceased. Father had a fall and subsequently developed pneumonia and passed away. He had a history of kidney stones. Mother had history of arrhythmia and died of pancreatic cancer.         Review of Systems  Constitutional: Negative.   HENT: Negative.   Eyes: Negative.   Respiratory: Negative.     Cardiovascular: Negative.   Gastrointestinal: Negative.   Genitourinary: Negative.   Allergic/Immunologic: Positive for environmental allergies.  Neurological: Negative.        History of migraine headaches  Hematological: Negative.   Psychiatric/Behavioral: Negative.        Objective:   Physical Exam  Constitutional: She is oriented to person, place, and time. She appears well-developed and well-nourished. No distress.  HENT:  Head: Normocephalic and atraumatic.  Right Ear: External ear normal.  Left Ear: External ear normal.  Mouth/Throat: Oropharynx is clear and moist. No oropharyngeal exudate.  Eyes: Conjunctivae and EOM are normal. Pupils are equal, round, and reactive to light. Right eye exhibits no discharge. Left eye exhibits no discharge. No scleral icterus.  Neck: Neck supple. No JVD present. No thyromegaly present.  Cardiovascular: Normal rate, regular rhythm, normal heart sounds and intact distal pulses.   No murmur heard. Pulmonary/Chest: Effort normal and breath sounds normal. No respiratory distress. She has no wheezes. She has no rales.  Abdominal: Soft. Bowel sounds are normal. She exhibits no distension and no mass. There is no tenderness. There is no rebound and no guarding.  Genitourinary:  Bimanual exam normal. Pap done 2012/07/26 which was normal  Musculoskeletal: Normal range of motion. She exhibits no edema.  Lymphadenopathy:    She has no cervical adenopathy.  Neurological: She is alert and oriented to person, place, and time. She has normal reflexes. She displays normal reflexes. No cranial nerve deficit. Coordination normal.  Skin: Skin is warm and  dry. No rash noted. She is not diaphoretic.  Psychiatric: She has a normal mood and affect. Her behavior is normal. Judgment and thought content normal.  Vitals reviewed.         Assessment & Plan:  History of migraine headaches  History of bilateral hip pain and right trochanteric bursitis-not mentioned  this year  History of allergic rhinitis  Osteopenia  History of LDL cholesterol treated with diet and exercise  Mild anxiety-treated with Xanax which she takes sparingly  History of insomnia which she takes Xanax sparingly  Plan: Prevnar 13 given. Recommend annual mammogram. Bone density study should be done in 2017. Fasting labs are pending. Refill Imitrex. Return in one year or as needed. Refill Xanax.       Subjective:   Patient presents for Medicare Annual/Subsequent preventive examination.  Review Past Medical/Family/Social: See above   Risk Factors  Current exercise habits: Walks 2 miles daily  Dietary issues discussed: Low fat low carbohydrate  Cardiac risk factors: Elevated LDL  Depression Screen  (Note: if answer to either of the following is "Yes", a more complete depression screening is indicated)   Over the past two weeks, have you felt down, depressed or hopeless? No  Over the past two weeks, have you felt little interest or pleasure in doing things? No Have you lost interest or pleasure in daily life? No Do you often feel hopeless? No Do you cry easily over simple problems? No   Activities of Daily Living  In your present state of health, do you have any difficulty performing the following activities?:   Driving? No  Managing money? No  Feeding yourself? No  Getting from bed to chair? No  Climbing a flight of stairs? No  Preparing food and eating?: No  Bathing or showering? No  Getting dressed: No  Getting to the toilet? No  Using the toilet:No  Moving around from place to place: No  In the past year have you fallen or had a near fall?:No  Are you sexually active? No  Do you have more than one partner? No   Hearing Difficulties: No  Do you often ask people to speak up or repeat themselves? No  Do you experience ringing or noises in your ears? No  Do you have difficulty understanding soft or whispered voices? No  Do you feel that you have a  problem with memory? No Do you often misplace items? No    Home Safety:  Do you have a smoke alarm at your residence? Yes Do you have grab bars in the bathroom? No Do you have throw rugs in your house? No   Cognitive Testing  Alert? Yes Normal Appearance?Yes  Oriented to person? Yes Place? Yes  Time? Yes  Recall of three objects? Yes  Can perform simple calculations? Yes  Displays appropriate judgment?Yes  Can read the correct time from a watch face?Yes   List the Names of Other Physician/Practitioners you currently use:  See referral list for the physicians patient is currently seeing.     Review of Systems: See above   Objective:     General appearance: Appears stated age  Head: Normocephalic, without obvious abnormality, atraumatic  Eyes: conj clear, EOMi PEERLA  Ears: normal TM's and external ear canals both ears  Nose: Nares normal. Septum midline. Mucosa normal. No drainage or sinus tenderness.  Throat: lips, mucosa, and tongue normal; teeth and gums normal  Neck: no adenopathy, no carotid bruit, no JVD, supple, symmetrical, trachea midline  and thyroid not enlarged, symmetric, no tenderness/mass/nodules  No CVA tenderness.  Lungs: clear to auscultation bilaterally  Breasts: normal appearance, no masses or tenderness Heart: regular rate and rhythm, S1, S2 normal, no murmur, click, rub or gallop  Abdomen: soft, non-tender; bowel sounds normal; no masses, no organomegaly  Musculoskeletal: ROM normal in all joints, no crepitus, no deformity, Normal muscle strengthen. Back  is symmetric, no curvature. Skin: Skin color, texture, turgor normal. No rashes or lesions  Lymph nodes: Cervical, supraclavicular, and axillary nodes normal.  Neurologic: CN 2 -12 Normal, Normal symmetric reflexes. Normal coordination and gait  Psych: Alert & Oriented x 3, Mood appear stable.    Assessment:    Annual wellness medicare exam   Plan:    During the course of the visit the  patient was educated and counseled about appropriate screening and preventive services including:   Prevnar 13  Annual mammogram  Bone density 2017     Patient Instructions (the written plan) was given to the patient.  Medicare Attestation  I have personally reviewed:  The patient's medical and social history  Their use of alcohol, tobacco or illicit drugs  Their current medications and supplements  The patient's functional ability including ADLs,fall risks, home safety risks, cognitive, and hearing and visual impairment  Diet and physical activities  Evidence for depression or mood disorders  The patient's weight, height, BMI, and visual acuity have been recorded in the chart. I have made referrals, counseling, and provided education to the patient based on review of the above and I have provided the patient with a written personalized care plan for preventive services.

## 2014-09-14 ENCOUNTER — Telehealth: Payer: Self-pay | Admitting: *Deleted

## 2014-09-14 LAB — VITAMIN D 25 HYDROXY (VIT D DEFICIENCY, FRACTURES): Vit D, 25-Hydroxy: 21 ng/mL — ABNORMAL LOW (ref 30–100)

## 2014-09-14 NOTE — Telephone Encounter (Signed)
Reviewed lab work with patient. Patient will start Vit D3 2000 units for low vit D level.

## 2015-05-30 DIAGNOSIS — J019 Acute sinusitis, unspecified: Secondary | ICD-10-CM | POA: Diagnosis not present

## 2015-05-30 DIAGNOSIS — H6503 Acute serous otitis media, bilateral: Secondary | ICD-10-CM | POA: Diagnosis not present

## 2015-08-10 DIAGNOSIS — J019 Acute sinusitis, unspecified: Secondary | ICD-10-CM | POA: Diagnosis not present

## 2015-08-15 DIAGNOSIS — J209 Acute bronchitis, unspecified: Secondary | ICD-10-CM | POA: Diagnosis not present

## 2015-11-03 ENCOUNTER — Encounter: Payer: Medicare Other | Admitting: Internal Medicine

## 2015-11-03 ENCOUNTER — Encounter: Payer: Self-pay | Admitting: Internal Medicine

## 2015-11-03 ENCOUNTER — Ambulatory Visit (INDEPENDENT_AMBULATORY_CARE_PROVIDER_SITE_OTHER): Payer: Medicare Other | Admitting: Internal Medicine

## 2015-11-03 VITALS — BP 110/64 | HR 72 | Temp 97.8°F | Ht 63.0 in | Wt 148.5 lb

## 2015-11-03 DIAGNOSIS — F419 Anxiety disorder, unspecified: Secondary | ICD-10-CM

## 2015-11-03 DIAGNOSIS — E7800 Pure hypercholesterolemia, unspecified: Secondary | ICD-10-CM | POA: Diagnosis not present

## 2015-11-03 DIAGNOSIS — M8580 Other specified disorders of bone density and structure, unspecified site: Secondary | ICD-10-CM

## 2015-11-03 DIAGNOSIS — Z8669 Personal history of other diseases of the nervous system and sense organs: Secondary | ICD-10-CM

## 2015-11-03 DIAGNOSIS — Z23 Encounter for immunization: Secondary | ICD-10-CM | POA: Diagnosis not present

## 2015-11-03 DIAGNOSIS — G4700 Insomnia, unspecified: Secondary | ICD-10-CM

## 2015-11-03 DIAGNOSIS — Z0000 Encounter for general adult medical examination without abnormal findings: Secondary | ICD-10-CM

## 2015-11-03 LAB — CBC WITH DIFFERENTIAL/PLATELET
Basophils Absolute: 52 cells/uL (ref 0–200)
Basophils Relative: 1 %
Eosinophils Absolute: 104 cells/uL (ref 15–500)
Eosinophils Relative: 2 %
HCT: 42.5 % (ref 35.0–45.0)
Hemoglobin: 14.1 g/dL (ref 11.7–15.5)
Lymphocytes Relative: 44 %
Lymphs Abs: 2288 cells/uL (ref 850–3900)
MCH: 29.9 pg (ref 27.0–33.0)
MCHC: 33.2 g/dL (ref 32.0–36.0)
MCV: 90.2 fL (ref 80.0–100.0)
MPV: 10.3 fL (ref 7.5–12.5)
Monocytes Absolute: 364 cells/uL (ref 200–950)
Monocytes Relative: 7 %
Neutro Abs: 2392 cells/uL (ref 1500–7800)
Neutrophils Relative %: 46 %
Platelets: 235 10*3/uL (ref 140–400)
RBC: 4.71 MIL/uL (ref 3.80–5.10)
RDW: 12.7 % (ref 11.0–15.0)
WBC: 5.2 10*3/uL (ref 3.8–10.8)

## 2015-11-03 LAB — COMPLETE METABOLIC PANEL WITH GFR
ALT: 23 U/L (ref 6–29)
AST: 25 U/L (ref 10–35)
Albumin: 4.4 g/dL (ref 3.6–5.1)
Alkaline Phosphatase: 57 U/L (ref 33–130)
BUN: 16 mg/dL (ref 7–25)
CO2: 27 mmol/L (ref 20–31)
Calcium: 9.5 mg/dL (ref 8.6–10.4)
Chloride: 105 mmol/L (ref 98–110)
Creat: 0.67 mg/dL (ref 0.50–0.99)
GFR, Est African American: >89 mL/min (ref >=60)
GFR, Est Non African American: >89 mL/min (ref >=60)
Glucose, Bld: 87 mg/dL (ref 65–99)
Potassium: 4.5 mmol/L (ref 3.5–5.3)
Sodium: 140 mmol/L (ref 135–146)
Total Bilirubin: 0.7 mg/dL (ref 0.2–1.2)
Total Protein: 7 g/dL (ref 6.1–8.1)

## 2015-11-03 LAB — TSH: TSH: 1.64 mIU/L

## 2015-11-03 LAB — LIPID PANEL
Cholesterol: 220 mg/dL — ABNORMAL HIGH (ref 125–200)
HDL: 97 mg/dL (ref >=46)
LDL Cholesterol: 103 mg/dL (ref <130)
Total CHOL/HDL Ratio: 2.3 Ratio (ref <=5.0)
Triglycerides: 98 mg/dL (ref <150)
VLDL: 20 mg/dL (ref <30)

## 2015-11-03 MED ORDER — ALPRAZOLAM 0.5 MG PO TABS: ORAL_TABLET | ORAL | 1 refills | Status: DC

## 2015-11-03 MED ORDER — SUMATRIPTAN SUCCINATE 50 MG PO TABS: ORAL_TABLET | ORAL | 1 refills | Status: DC

## 2015-11-04 LAB — VITAMIN D 25 HYDROXY (VIT D DEFICIENCY, FRACTURES): Vit D, 25-Hydroxy: 36 ng/mL (ref 30–100)

## 2015-11-16 NOTE — Patient Instructions (Addendum)
It was pleasure to see you today. Continue same medications and return in one year. Continue vitamin D supplementation. Bone density study to be done 2018. Flu vaccine given.

## 2015-11-16 NOTE — Progress Notes (Signed)
Subjective:    Patient ID: Roberta Peters, female    DOB: 1951-02-06, 65 y.o.   MRN: [id number]  HPI 65 year old Female in today for health maintenance exam and evaluation of medical issues. Continues to live in [place]. Has questions about radon. This is been detected in her home. She's had someone out to try to clear it up.  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 She had mild bursitis and tendinitis over the greater trochanter of the right hip. She was treated with a short course of prednisone for right trochanteric bursitis and did well.  History of migraine headaches for which she uses triptan medication.  History of mild anxiety, insomnia, osteopenia, history of HSV type I, history of GE reflux, history of C6-C7 radiculopathy in Jul 08, 2003. Had tetanus immunization March 2003.  No history of serious illnesses accidents or operations.  No known drug allergies. Total of 3 pregnancies and one miscarriage.  Social history she is a widow. Husband died from kidney cancer in 07-Jul-2005. He was CEO of the [employer]. He was a Teacher, music. Her 2 sons are now working with the company. 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 Does not smoke. Social alcohol consumption. She divides her time between [place], [place] where radon has been detected in her home  and [place] but is mostly in [place].  Bone density study done in Jul 08, 2014 [facility]. At one point she was tried on bisphosphonates for osteopenia but that caused GE reflux. Continue to monitor osteopenia with vitamin D supplementation only.  Had colonoscopy in [place] in 08-Jul-2011 and was given 10 year follow-up. Was told it was normal.  Family history: 2 adult sons. Youngest son is married and has 2 children. 2 sisters alive and well. One sister with history of epilepsy and is stable. Parents are deceased. Father had a fall and  subsequently developed pneumonia in past way. He had a history of kidney stones. Mother had history of arrhythmia and died of pancreatic cancer.    Review of Systems noncontributory     Objective:   Physical Exam  Constitutional: She is oriented to person, place, and time. She appears well-developed and well-nourished. No distress.  HENT:  Head: Normocephalic and atraumatic.  Right Ear: External ear normal.  Left Ear: External ear normal.  Mouth/Throat: Oropharynx is clear and moist.  Eyes: Conjunctivae and EOM are normal. Pupils are equal, round, and reactive to light. Right eye exhibits no discharge. Left eye exhibits no discharge. No scleral icterus.  Neck: Neck supple. No JVD present. No thyromegaly present.  Cardiovascular: Normal rate, regular rhythm, normal heart sounds and intact distal pulses.   No murmur heard. Pulmonary/Chest: Effort normal and breath sounds normal. No respiratory distress. She has no wheezes. She has no rales.  Abdominal: Soft. Bowel sounds are normal. She exhibits no distension and no mass. There is no tenderness. There is no rebound and no guarding.  Genitourinary:  Genitourinary Comments: Pap deferred due to age. Bimanual normal.  Musculoskeletal: She exhibits no edema.  Lymphadenopathy:    She has no cervical adenopathy.  Neurological: She is alert and oriented to person, place, and time. She has normal reflexes. No cranial nerve deficit. Coordination normal.  Skin: Skin is warm and dry. No rash noted. She is not diaphoretic.  Psychiatric: She has a normal mood and affect. Her behavior is normal. Judgment and  thought content normal.  Vitals reviewed.         Assessment & Plan:  Normal health maintenance exam  Anxiety  Insomnia  Osteopenia-bisphosphonate intolerant. Continue vitamin D supplementation and continue to monitor. Bone density study due next year - 2018  History of migraine headaches  History of allergic rhinitis  Plan:  Refill Xanax and triptan medication. Return in one year or as needed. Had Prevnar 13 and 2016. Recommend annual mammogram.  Subjective:   Patient presents for Medicare Annual/Subsequent preventive examination.  Review Past Medical/Family/Social:See above   Risk Factors  Current exercise habits: Stays active and walks Dietary issues discussed: Low fat low carbohydrate  Cardiac risk factors: none  Depression Screen  (Note: if answer to either of the following is "Yes", a more complete depression screening is indicated)   Over the past two weeks, have you felt down, depressed or hopeless? No  Over the past two weeks, have you felt little interest or pleasure in doing things? No Have you lost interest or pleasure in daily life? No Do you often feel hopeless? No Do you cry easily over simple problems? No   Activities of Daily Living  In your present state of health, do you have any difficulty performing the following activities?:   Driving? No  Managing money? No  Feeding yourself? No  Getting from bed to chair? No  Climbing a flight of stairs? No  Preparing food and eating?: No  Bathing or showering? No  Getting dressed: No  Getting to the toilet? No  Using the toilet:No  Moving around from place to place: No  In the past year have you fallen or had a near fall?:No  Are you sexually active? No  Do you have more than one partner? No   Hearing Difficulties: No  Do you often ask people to speak up or repeat themselves? No  Do you experience ringing or noises in your ears? No  Do you have difficulty understanding soft or whispered voices? No  Do you feel that you have a problem with memory? No Do you often misplace items? No    Home Safety:  Do you have a smoke alarm at your residence? Yes Do you have grab bars in the bathroom?No Do you have throw rugs in your house? No   Cognitive Testing  Alert? Yes Normal Appearance?Yes  Oriented to person? Yes Place? Yes  Time?  Yes  Recall of three objects? Yes  Can perform simple calculations? Yes  Displays appropriate judgment?Yes  Can read the correct time from a watch face?Yes   List the Names of Other Physician/Practitioners you currently use:  See referral list for the physicians patient is currently seeing.     Review of Systems: See above   Objective:     General appearance: Appears stated age   Head: Normocephalic, without obvious abnormality, atraumatic  Eyes: conj clear, EOMi PEERLA  Ears: normal TM's and external ear canals both ears  Nose: Nares normal. Septum midline. Mucosa normal. No drainage or sinus tenderness.  Throat: lips, mucosa, and tongue normal; teeth and gums normal  Neck: no adenopathy, no carotid bruit, no JVD, supple, symmetrical, trachea midline and thyroid not enlarged, symmetric, no tenderness/mass/nodules  No CVA tenderness.  Lungs: clear to auscultation bilaterally  Breasts: normal appearance, no masses or tenderness Heart: regular rate and rhythm, S1, S2 normal, no murmur, click, rub or gallop  Abdomen: soft, non-tender; bowel sounds normal; no masses, no organomegaly  Musculoskeletal: ROM normal in  all joints, no crepitus, no deformity, Normal muscle strengthen. Back  is symmetric, no curvature. Skin: Skin color, texture, turgor normal. No rashes or lesions  Lymph nodes: Cervical, supraclavicular, and axillary nodes normal.  Neurologic: CN 2 -12 Normal, Normal symmetric reflexes. Normal coordination and gait  Psych: Alert & Oriented x 3, Mood appear stable.    Assessment:    Annual wellness medicare exam   Plan:    During the course of the visit the patient was educated and counseled about appropriate screening and preventive services including:  Annual mammogram Flu vaccine given     Patient Instructions (the written plan) was given to the patient.  Medicare Attestation  I have personally reviewed:  The patient's medical and social history  Their use  of alcohol, tobacco or illicit drugs  Their current medications and supplements  The patient's functional ability including ADLs,fall risks, home safety risks, cognitive, and hearing and visual impairment  Diet and physical activities  Evidence for depression or mood disorders  The patient's weight, height, BMI, and visual acuity have been recorded in the chart. I have made referrals, counseling, and provided education to the patient based on review of the above and I have provided the patient with a written personalized care plan for preventive services.

## 2015-11-24 NOTE — Progress Notes (Signed)
This encounter was created in error - please disregard.

## 2016-01-11 DIAGNOSIS — R921 Mammographic calcification found on diagnostic imaging of breast: Secondary | ICD-10-CM | POA: Diagnosis not present

## 2016-02-29 ENCOUNTER — Encounter: Payer: Self-pay | Admitting: Internal Medicine

## 2016-03-22 DIAGNOSIS — M545 Low back pain: Secondary | ICD-10-CM | POA: Diagnosis not present

## 2016-03-22 DIAGNOSIS — S39012A Strain of muscle, fascia and tendon of lower back, initial encounter: Secondary | ICD-10-CM | POA: Diagnosis not present

## 2016-08-13 DIAGNOSIS — J019 Acute sinusitis, unspecified: Secondary | ICD-10-CM | POA: Diagnosis not present

## 2016-08-13 DIAGNOSIS — R05 Cough: Secondary | ICD-10-CM | POA: Diagnosis not present

## 2016-08-13 DIAGNOSIS — H6503 Acute serous otitis media, bilateral: Secondary | ICD-10-CM | POA: Diagnosis not present

## 2016-08-21 ENCOUNTER — Ambulatory Visit (INDEPENDENT_AMBULATORY_CARE_PROVIDER_SITE_OTHER): Payer: Medicare Other | Admitting: Internal Medicine

## 2016-08-21 ENCOUNTER — Encounter: Payer: Self-pay | Admitting: Internal Medicine

## 2016-08-21 VITALS — BP 112/78 | HR 62 | Temp 98.0°F | Wt 150.0 lb

## 2016-08-21 DIAGNOSIS — M545 Low back pain: Secondary | ICD-10-CM | POA: Diagnosis not present

## 2016-08-21 DIAGNOSIS — J0100 Acute maxillary sinusitis, unspecified: Secondary | ICD-10-CM | POA: Diagnosis not present

## 2016-08-21 DIAGNOSIS — Z8669 Personal history of other diseases of the nervous system and sense organs: Secondary | ICD-10-CM

## 2016-08-21 LAB — POCT URINALYSIS DIPSTICK
Bilirubin, UA: NEGATIVE
Blood, UA: NEGATIVE
Glucose, UA: NEGATIVE
Ketones, UA: NEGATIVE
Leukocytes, UA: NEGATIVE
Nitrite, UA: NEGATIVE
Protein, UA: NEGATIVE
Spec Grav, UA: 1.01 (ref 1.010–1.025)
Urobilinogen, UA: 0.2 E.U./dL
pH, UA: 6 (ref 5.0–8.0)

## 2016-08-21 MED ORDER — PREDNISONE 10 MG PO TABS: ORAL_TABLET | ORAL | 0 refills | Status: DC

## 2016-08-21 NOTE — Progress Notes (Signed)
   Subjective:    Patient ID: Roberta Peters, female    DOB: 1950-11-19, 66 y.o.   MRN: [id number]  HPI   Has had  headache for a week or so. Went to see nurse practitioner [place] and was given BorgWarnermnicef. She got better. Hx migraine headaches but this seemed different to her that her regular migraines.. However, took migraine med with some relief. She thinks she had a sinus infection.  Also had low back pain recently. It radiated down right leg.Seems to be better now.  She recently sold her condominium here and bought a house. However spends most of her time in [place], [place] where she has another home.    Review of Systems see above     Objective:   Physical Exam Extraocular movements are full. PERRLA. TMs and pharynx are clear. Neck is supple without thyromegaly. Chest clear. Cardiac exam regular rate and rhythm. Straight leg raising is negative at 90 bilaterally with normal muscle strength and normal deep tendon reflexes.       Assessment & Plan:  Protracted headache hard to discern if it was migraine or related to maxillary sinusitis but has improved with antibiotics.  Low back pain-improved  Plan: Sterapred 10 mg 6 day dosepak take as directed should she have recurrence of migraine headache. This may also help with back pain. Physical exam due in mid September 2018.

## 2016-09-02 NOTE — Patient Instructions (Signed)
Take prednisone in tapering courses directed for back pain and/or protracted migraine headache. Follow-up in mid September for physical examination.

## 2016-09-04 ENCOUNTER — Telehealth: Payer: Self-pay | Admitting: Internal Medicine

## 2016-09-04 MED ORDER — PREDNISONE 10 MG PO TABS: ORAL_TABLET | ORAL | 0 refills | Status: DC

## 2016-09-04 NOTE — Telephone Encounter (Signed)
I thought she had a refill on Prednisone please check

## 2016-09-04 NOTE — Telephone Encounter (Signed)
Booked patient's CPE for 10/9.  While speaking with her, she indicated that her back pain has been relieved significantly with the Prednisone.  She will be [place] to [place] soon and states that she would love to be able to walk the beaches and be able to sight see with them and not be in pain from her back.  States that when she takes the Prednisone, she is able to be pain free.  She wants to know if you would be willing to call in another dose pack for her for this trip?  [facility] [place] [place]  Best # for contact:  [phone]

## 2016-09-04 NOTE — Telephone Encounter (Signed)
Pt is aware refill at pharmacy

## 2016-11-06 DIAGNOSIS — Z23 Encounter for immunization: Secondary | ICD-10-CM | POA: Diagnosis not present

## 2016-11-27 ENCOUNTER — Ambulatory Visit (INDEPENDENT_AMBULATORY_CARE_PROVIDER_SITE_OTHER): Payer: Medicare Other | Admitting: Internal Medicine

## 2016-11-27 ENCOUNTER — Encounter: Payer: Self-pay | Admitting: Internal Medicine

## 2016-11-27 VITALS — BP 110/70 | HR 60 | Temp 97.8°F | Ht 62.5 in | Wt 140.5 lb

## 2016-11-27 DIAGNOSIS — F419 Anxiety disorder, unspecified: Secondary | ICD-10-CM

## 2016-11-27 DIAGNOSIS — E2839 Other primary ovarian failure: Secondary | ICD-10-CM | POA: Diagnosis not present

## 2016-11-27 DIAGNOSIS — M8580 Other specified disorders of bone density and structure, unspecified site: Secondary | ICD-10-CM | POA: Diagnosis not present

## 2016-11-27 DIAGNOSIS — E7800 Pure hypercholesterolemia, unspecified: Secondary | ICD-10-CM | POA: Diagnosis not present

## 2016-11-27 DIAGNOSIS — Z23 Encounter for immunization: Secondary | ICD-10-CM | POA: Diagnosis not present

## 2016-11-27 DIAGNOSIS — G4709 Other insomnia: Secondary | ICD-10-CM

## 2016-11-27 DIAGNOSIS — Z8669 Personal history of other diseases of the nervous system and sense organs: Secondary | ICD-10-CM

## 2016-11-27 DIAGNOSIS — M7918 Myalgia, other site: Secondary | ICD-10-CM | POA: Diagnosis not present

## 2016-11-27 DIAGNOSIS — Z0000 Encounter for general adult medical examination without abnormal findings: Secondary | ICD-10-CM

## 2016-11-27 LAB — CBC WITH DIFFERENTIAL/PLATELET
Basophils Absolute: 41 cells/uL (ref 0–200)
Basophils Relative: 0.9 %
Eosinophils Absolute: 117 cells/uL (ref 15–500)
Eosinophils Relative: 2.6 %
HCT: 44.3 % (ref 35.0–45.0)
Hemoglobin: 14.6 g/dL (ref 11.7–15.5)
Lymphs Abs: 1787 cells/uL (ref 850–3900)
MCH: 29.6 pg (ref 27.0–33.0)
MCHC: 33 g/dL (ref 32.0–36.0)
MCV: 89.7 fL (ref 80.0–100.0)
MPV: 11.6 fL (ref 7.5–12.5)
Monocytes Relative: 9.7 %
Neutro Abs: 2120 cells/uL (ref 1500–7800)
Neutrophils Relative %: 47.1 %
Platelets: 205 10*3/uL (ref 140–400)
RBC: 4.94 10*6/uL (ref 3.80–5.10)
RDW: 12.4 % (ref 11.0–15.0)
Total Lymphocyte: 39.7 %
WBC mixed population: 437 cells/uL (ref 200–950)
WBC: 4.5 10*3/uL (ref 3.8–10.8)

## 2016-11-27 LAB — COMPLETE METABOLIC PANEL WITH GFR
AG Ratio: 1.6 (calc) (ref 1.0–2.5)
ALT: 21 U/L (ref 6–29)
AST: 26 U/L (ref 10–35)
Albumin: 4.5 g/dL (ref 3.6–5.1)
Alkaline phosphatase (APISO): 62 U/L (ref 33–130)
BUN: 12 mg/dL (ref 7–25)
CO2: 27 mmol/L (ref 20–32)
Calcium: 9.8 mg/dL (ref 8.6–10.4)
Chloride: 102 mmol/L (ref 98–110)
Creat: 0.67 mg/dL (ref 0.50–0.99)
GFR, Est African American: 105 mL/min/{1.73_m2} (ref >=60)
GFR, Est Non African American: 91 mL/min/{1.73_m2} (ref >=60)
Globulin: 2.8 g/dL (calc) (ref 1.9–3.7)
Glucose, Bld: 91 mg/dL (ref 65–99)
Potassium: 4.1 mmol/L (ref 3.5–5.3)
Sodium: 138 mmol/L (ref 135–146)
Total Bilirubin: 1 mg/dL (ref 0.2–1.2)
Total Protein: 7.3 g/dL (ref 6.1–8.1)

## 2016-11-27 LAB — LIPID PANEL
Cholesterol: 205 mg/dL — ABNORMAL HIGH (ref <200)
HDL: 89 mg/dL (ref >50)
LDL Cholesterol (Calc): 96 mg/dL (calc)
Non-HDL Cholesterol (Calc): 116 mg/dL (calc) (ref <130)
Total CHOL/HDL Ratio: 2.3 (calc) (ref <5.0)
Triglycerides: 107 mg/dL (ref <150)

## 2016-11-27 LAB — TSH: TSH: 2.94 mIU/L (ref 0.40–4.50)

## 2016-11-27 MED ORDER — MELOXICAM 15 MG PO TABS: 15.0000 mg | ORAL_TABLET | Freq: Every day | ORAL | 5 refills | Status: DC

## 2016-11-27 MED ORDER — SUMATRIPTAN SUCCINATE 50 MG PO TABS: ORAL_TABLET | ORAL | 1 refills | Status: DC

## 2016-11-27 MED ORDER — PREDNISONE 10 MG PO TABS: ORAL_TABLET | ORAL | 0 refills | Status: DC

## 2016-11-27 MED ORDER — ALPRAZOLAM 0.5 MG PO TABS: ORAL_TABLET | ORAL | 1 refills | Status: DC

## 2016-11-27 NOTE — Progress Notes (Signed)
Subjective:    Patient ID: Roberta Peters, female    DOB: 12-27-1950, 66 y.o.   MRN: [id number]  HPI   66 year old White Female for health maintenance exam, Medicare wellness,  and evaluation of medical issues. Has some musculoskeletal pain.  Migraine headaches treated with triptan medication.  Continues to live in [place] but also has a home here.  At last visit was concerned about radon in her home but had it treated and removed from her home in [place].  History of mild anxiety, insomnia, osteopenia, history of HSV type I, history of GE reflux, history of C6-C7 radiculopathy in 2003-07-19.  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  She had mild bursitis and tendon tinnitus over the greater trochanter of the right hip.  She was treated with a short course of prednisone for right trochanteric bursitis and did well.  For musculoskeletal pain were going to try meloxicam 15 mg daily.  No history of serious illnesses accidents or operations.  No known drug allergies.  A total of 3 pregnancies and 1 miscarriage.  Social history: She is a widow.  Husband died from kidney cancer in 18-Jul-2005.  He was CEO of the [employer].  He was a Teacher, music.  Her 2 sons are now working with the company.  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  Does not smoke.  Social alcohol consumption.  It divides her time between [place] in [place] but is mostly in [place].  Bone density study done in Jul 19, 2014 at [facility].  At one point she was tried on bisphosphonates for osteopenia but that caused GE reflux.  Continue to monitor osteopenia with vitamin D supplement only.  Had colonoscopy in [place] in 19-Jul-2011 and was given 10-year follow-up and was told it was normal.  Family history: 2 adult sons.  Youngest son is married and has 2 children.  2 sisters alive and well.  One sister with history of epilepsy  and is stable.  Parents are  deceased.  Father had a fall and subsequently developed pneumonia and passed away.  He had a history of kidney stones.  Mother had history of arrhythmia and died of pancreatic cancer.    Review of Systems  Constitutional: Negative.   All other systems reviewed and are negative.      Objective:   Physical Exam  Constitutional: She is oriented to person, place, and time. She appears well-developed and well-nourished. No distress.  HENT:  Head: Normocephalic and atraumatic.  Right Ear: External ear normal.  Left Ear: External ear normal.  Mouth/Throat: Oropharynx is clear and moist. No oropharyngeal exudate.  Eyes: Pupils are equal, round, and reactive to light. Conjunctivae and EOM are normal. Right eye exhibits no discharge. Left eye exhibits no discharge. No scleral icterus.  Neck: Neck supple. No JVD present. No thyromegaly present.  Cardiovascular: Normal rate, regular rhythm, normal heart sounds and intact distal pulses.   No murmur heard. Pulmonary/Chest: Effort normal and breath sounds normal. No respiratory distress. She has no wheezes. She has no rales.  Abdominal: Soft. Bowel sounds are normal. She exhibits no distension and no mass. There is no tenderness. There is no rebound and no guarding.  Genitourinary:  Genitourinary Comments: Pap deferred due to age.  Bimanual normal.  Musculoskeletal: She exhibits no edema.  Lymphadenopathy:    She has no cervical adenopathy.  Neurological: She is alert and oriented to person, place, and time. She has normal reflexes. No cranial nerve deficit. Coordination normal.  Skin: Skin is warm and dry. No rash noted. She is not diaphoretic.  Psychiatric: She has a normal mood and affect. Her behavior is normal. Judgment and thought content normal.  Vitals reviewed.         Assessment & Plan:  History of migraine headaches  Musculoskeletal pain-try meloxicam  Insomnia  Osteopenia-bisphosphonate  intolerant.  Bone density study due 2018.  History of migraine headaches  History of allergic rhinitis  Anxiety-refill Xanax.  Recommend annual mammogram and.  Return in 1 year or as needed.  Subjective:   Patient presents for Medicare Annual/Subsequent preventive examination.  Review Past Medical/Family/Social: See above     Current exercise habits: Walks for exercise Dietary issues discussed:   Cardiac risk factors: None  Depression Screen  (Note: if answer to either of the following is "Yes", a more complete depression screening is indicated)   Over the past two weeks, have you felt down, depressed or hopeless? No  Over the past two weeks, have you felt little interest or pleasure in doing things? No Have you lost interest or pleasure in daily life? No Do you often feel hopeless? No Do you cry easily over simple problems? No   Activities of Daily Living  In your present state of health, do you have any difficulty performing the following activities?:   Driving? No  Managing money? No  Feeding yourself? No  Getting from bed to chair? No  Climbing a flight of stairs? No  Preparing food and eating?: No  Bathing or showering? No  Getting dressed: No  Getting to the toilet? No  Using the toilet:No  Moving around from place to place: No  In the past year have you fallen or had a near fall?:No  Are you sexually active? No  Do you have more than one partner? No   Hearing Difficulties: No  Do you often ask people to speak up or repeat themselves? No  Do you experience ringing or noises in your ears? No  Do you have difficulty understanding soft or whispered voices? No  Do you feel that you have a problem with memory? No Do you often misplace items? No    Home Safety:  Do you have a smoke alarm at your residence? Yes Do you have grab bars in the bathroom?  No Do you have throw rugs in your house?  Yes   Cognitive Testing  Alert? Yes Normal Appearance?Yes    Oriented to person? Yes Place? Yes  Time? Yes  Recall of three objects? Yes  Can perform simple calculations? Yes  Displays appropriate judgment?Yes  Can read the correct time from a watch face?Yes   List the Names of Other Physician/Practitioners you currently use:  See referral list for the physicians patient is currently seeing.     Review of Systems: See above  Objective:     General appearance: Appears younger than stated Head: Normocephalic, without obvious abnormality, atraumatic  Eyes: conj clear, EOMi PEERLA  Ears: normal TM's and external ear canals both ears  Nose: Nares normal. Septum midline. Mucosa normal. No drainage or sinus tenderness.  Throat: lips, mucosa, and tongue normal; teeth and gums normal  Neck: no adenopathy, no carotid bruit, no JVD, supple, symmetrical, trachea midline and thyroid not enlarged, symmetric, no tenderness/mass/nodules  No CVA tenderness.  Lungs: clear to auscultation bilaterally  Breasts: normal appearance,  no masses or tenderness Heart: regular rate and rhythm, S1, S2 normal, no murmur, click, rub or gallop  Abdomen: soft, non-tender; bowel sounds normal; no masses, no organomegaly  Musculoskeletal: ROM normal in all joints, no crepitus, no deformity, Normal muscle strengthen. Back  is symmetric, no curvature. Skin: Skin color, texture, turgor normal. No rashes or lesions  Lymph nodes: Cervical, supraclavicular, and axillary nodes normal.  Neurologic: CN 2 -12 Normal, Normal symmetric reflexes. Normal coordination and gait  Psych: Alert & Oriented x 3, Mood appear stable.    Assessment:    Annual wellness medicare exam   Plan:    During the course of the visit the patient was educated and counseled about appropriate screening and preventive services including:    Annual mammogram    Patient Instructions (the written plan) was given to the patient.  Medicare Attestation  I have personally reviewed:  The patient's  medical and social history  Their use of alcohol, tobacco or illicit drugs  Their current medications and supplements  The patient's functional ability including ADLs,fall risks, home safety risks, cognitive, and hearing and visual impairment  Diet and physical activities  Evidence for depression or mood disorders  The patient's weight, height, BMI, and visual acuity have been recorded in the chart. I have made referrals, counseling, and provided education to the patient based on review of the above and I have provided the patient with a written personalized care plan for preventive services.

## 2016-12-18 NOTE — Patient Instructions (Signed)
Continue diet and exercise regimen.  Take vitamin D supplement.  Bone density study every 2 years.  Try meloxicam for musculoskeletal pain.

## 2016-12-31 ENCOUNTER — Telehealth: Payer: Self-pay

## 2016-12-31 MED ORDER — MELOXICAM 15 MG PO TABS: 15.0000 mg | ORAL_TABLET | Freq: Every day | ORAL | 3 refills | Status: DC

## 2016-12-31 NOTE — Telephone Encounter (Signed)
[facility] to [facility] Mel[facility]am 15mg  for patient. Medication was refilled per Dr. Beryle QuantBaxley's request. Sent 1 year

## 2017-02-08 ENCOUNTER — Encounter: Payer: Self-pay | Admitting: Internal Medicine

## 2017-02-08 DIAGNOSIS — M8589 Other specified disorders of bone density and structure, multiple sites: Secondary | ICD-10-CM | POA: Diagnosis not present

## 2017-02-08 DIAGNOSIS — Z1231 Encounter for screening mammogram for malignant neoplasm of breast: Secondary | ICD-10-CM | POA: Diagnosis not present

## 2017-06-05 DIAGNOSIS — R635 Abnormal weight gain: Secondary | ICD-10-CM | POA: Diagnosis not present

## 2017-06-05 DIAGNOSIS — M545 Low back pain: Secondary | ICD-10-CM | POA: Diagnosis not present

## 2017-08-18 DIAGNOSIS — L0390 Cellulitis, unspecified: Secondary | ICD-10-CM | POA: Diagnosis not present

## 2017-11-19 DIAGNOSIS — Z23 Encounter for immunization: Secondary | ICD-10-CM | POA: Diagnosis not present

## 2018-02-24 ENCOUNTER — Encounter: Payer: Self-pay | Admitting: Internal Medicine

## 2018-02-24 DIAGNOSIS — Z1231 Encounter for screening mammogram for malignant neoplasm of breast: Secondary | ICD-10-CM | POA: Diagnosis not present

## 2018-02-25 DIAGNOSIS — H524 Presbyopia: Secondary | ICD-10-CM | POA: Diagnosis not present

## 2018-02-25 DIAGNOSIS — H5213 Myopia, bilateral: Secondary | ICD-10-CM | POA: Diagnosis not present

## 2018-02-25 DIAGNOSIS — H52203 Unspecified astigmatism, bilateral: Secondary | ICD-10-CM | POA: Diagnosis not present

## 2018-05-05 DIAGNOSIS — M545 Low back pain: Secondary | ICD-10-CM | POA: Diagnosis not present

## 2018-05-05 DIAGNOSIS — H6503 Acute serous otitis media, bilateral: Secondary | ICD-10-CM | POA: Diagnosis not present

## 2018-05-05 DIAGNOSIS — J019 Acute sinusitis, unspecified: Secondary | ICD-10-CM | POA: Diagnosis not present

## 2018-05-05 DIAGNOSIS — R05 Cough: Secondary | ICD-10-CM | POA: Diagnosis not present

## 2018-06-06 ENCOUNTER — Other Ambulatory Visit: Payer: Self-pay | Admitting: Internal Medicine

## 2018-06-20 ENCOUNTER — Encounter: Payer: Self-pay | Admitting: Internal Medicine

## 2018-06-20 ENCOUNTER — Ambulatory Visit (INDEPENDENT_AMBULATORY_CARE_PROVIDER_SITE_OTHER): Payer: Medicare Other | Admitting: Internal Medicine

## 2018-06-20 VITALS — Ht 62.0 in | Wt 138.0 lb

## 2018-06-20 DIAGNOSIS — M8580 Other specified disorders of bone density and structure, unspecified site: Secondary | ICD-10-CM

## 2018-06-20 DIAGNOSIS — Z0000 Encounter for general adult medical examination without abnormal findings: Secondary | ICD-10-CM | POA: Diagnosis not present

## 2018-07-09 DIAGNOSIS — B009 Herpesviral infection, unspecified: Secondary | ICD-10-CM | POA: Diagnosis not present

## 2018-07-09 DIAGNOSIS — E782 Mixed hyperlipidemia: Secondary | ICD-10-CM | POA: Diagnosis not present

## 2018-07-13 NOTE — Progress Notes (Signed)
   Subjective:    Patient ID: Roberta Peters, female    DOB: April 28, 1950, 68 y.o.   MRN: [id number]  HPI 68 year old Female seen today by interactive audio and video telecommunications due to the coronavirus outbreak.  She is seen for Annual Medicare Wellness Visit with in person visit for complete physical exam and labs to be done at a later time.  She is identified as Roberta Peters. [place]ill using 2 identifiers as a patient in this practice.  She agrees to [employer] wellness visit in this format today.  Patient has a history of mild anxiety, insomnia, osteopenia, history of HSV type I, GE reflux, migraine headaches and history of C6-C7 radiculopathy in 05-13-2003.  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  No known drug allergies.  No history of serious illnesses accidents or operations.  Social history: She is a widow.  Husband died from kidney cancer in May 12, 2005.  He was [employer].  He was a Education administrator.  HER-2 sons are now working with a company.  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  She has a Scientist, water quality.  Does not smoke.  Social alcohol consumption.  She divides her time between [place] but currently spending most of her time at her home in [place]. Sons live in [place].  History of osteopenia.  Tried bisphosphonate medication for osteopenia but it caused GE reflux.  Have decided to monitor osteopenia with vitamin D supplement only.  She had colonoscopy in [place] in 05-13-11 was given 10-year follow-up.  Family history: 2 adult sons.  Youngest son is married and has 2 children.  2 sisters alive and well.  One sister with history of epilepsy but stable.  Parents are deceased.  Father had a fall, subsequently developed pneumonia and passed away.  History of kidney stones.  Brother had history of arrhythmia and a pancreatic cancer.  Patient reports she is doing well residing alone.  She is cooking a lot.  She continues to drive.  Has  not felt down depressed or hopeless.  Finding things to do about her phone.  Has not had issues managing money, climbing a flight of stairs, preparing food or eating, bathing or showering, using toilet.  She has not had a fall in the past year.  She is not sexually active.  She is a widow.  No issues with hearing.  No issues with memory.  Does not misplace items.  Has smoke alarm at the residents.  Grab bars in the bathroom.  Has large rugs in the home but no throw rugs for study.  Migraine headaches are under good control with symptomatic treatment.    Review of Systems No issues with appetite.  No constipation.  No shortness of breath.  No chest pain.  No abdominal pain.  Energy level is good.     Objective:   Physical Exam  Seen by virtual visit today and not examined.  Physical exam to be done at a later time with fasting lab work here in the office.      Assessment & Plan:  Osteopenia-intolerant.  Continue vitamin D supplement  History of migraine headaches  History of anxiety-Xanax taken sparingly  History of allergic rhinitis  History of occasional insomnia  Musculoskeletal pain previously treated with meloxicam  Plan: Recommend annual mammogram and annual flu vaccine.  Pneumococcal vaccines are up-to-date.  Tetanus immunization up-to-date.  Have discussed Shingrix vaccine at past visits.  Colonoscopy is up-to-date.

## 2018-07-13 NOTE — Patient Instructions (Addendum)
It was a pleasure to see you today by virtual visit for annual Medicare wellness visit.  Physical exam and fasting labs scheduled at our office 28,2020.

## 2018-07-31 ENCOUNTER — Encounter: Payer: Self-pay | Admitting: Internal Medicine

## 2018-07-31 ENCOUNTER — Ambulatory Visit (INDEPENDENT_AMBULATORY_CARE_PROVIDER_SITE_OTHER): Payer: Medicare Other | Admitting: Internal Medicine

## 2018-07-31 ENCOUNTER — Telehealth: Payer: Self-pay | Admitting: Internal Medicine

## 2018-07-31 ENCOUNTER — Other Ambulatory Visit: Payer: Self-pay

## 2018-07-31 DIAGNOSIS — R0981 Nasal congestion: Secondary | ICD-10-CM | POA: Diagnosis not present

## 2018-07-31 DIAGNOSIS — G43009 Migraine without aura, not intractable, without status migrainosus: Secondary | ICD-10-CM | POA: Diagnosis not present

## 2018-07-31 MED ORDER — PREDNISONE 10 MG PO TABS: ORAL_TABLET | ORAL | 1 refills | Status: DC

## 2018-07-31 NOTE — Telephone Encounter (Signed)
Scheduled virtual visit °

## 2018-07-31 NOTE — Progress Notes (Signed)
   Subjective:    Patient ID: Roberta Peters, female    DOB: 09-Dec-1950, 68 y.o.   MRN: [id number]  HPI 68 year old Female currently residing in [place] seen today by interactive audio and Careers information officer.  She also has a home here in [place] but is spending most of her time during the pandemic in [place].  She is agreeable to visit in this format today.  She is identified using 2 identifiers as Roberta Peters. Roberta Peters, a patient in this practice.  Patient has a history of migraine headaches.  From time to time we have given her prednisone to break the cycle of headache with success.  Patient says for several days she has had a frontal headache without fever, chills, nausea or vomiting.  No other neurological symptoms.  Has felt some nasal congestion and does have a slight cough.  Has been staying in except to go to the grocery store.  Has not traveled to [place] recently.  She feels that this episode is similar to previous episodes she has had.  She has no discolored nasal drainage and no discolored sputum production.   Review of Systems see above-denies sore throat or coryza.  Reports no issues with ataxia     Objective:   Physical Exam She is alert and oriented with no evidence of facial asymmetry or weakness.  She moves all 4 extremities without any problems.  She does not sound hoarse.  Extraocular movements are full.    Plan: Sterapred DS 10 mg tablets going from 60 mg to 0 mg and tapering course over 7 days.  She was given 1 refill on this prescription for future use if necessary.  Advised to take precautions being on steroids and limiting exposure with outings until course of prednisone is completed.  Call if symptoms do not improve or worsen.                   Assessment & Plan:  Migraine headache associated with allergic rhinitis.  Prednisone 10 mg tablets #21 to take in tapering course going from 60 mg to 0 mg over 7 days.  Not given  antibiotic at this point time thinking that there is not secondary bacterial infection such as acute sinusitis.  She is to call if symptoms do not improve after this course of treatment or worsen.  She was given 1 refill on prednisone taper to have on hand in the future if necessary.  She was reminded to stay away from crowds while on steroid therapy due to its immunosuppressive effects.

## 2018-07-31 NOTE — Telephone Encounter (Signed)
OK 

## 2018-07-31 NOTE — Patient Instructions (Signed)
Take prednisone in tapering course going from 60 mg i.e. 6 tablets to 0 mg over 6 days.  1 refill given.  Call if not improving in 48 hours or sooner if worse.

## 2018-07-31 NOTE — Telephone Encounter (Signed)
Roberta Peters [phone]  [name] called to say she is having some hay fever, sinus, and headaches, thinking she may need some prednisone, I reccommended virtual visit around 4:30 today.

## 2018-09-16 ENCOUNTER — Encounter: Payer: Self-pay | Admitting: Internal Medicine

## 2018-09-16 ENCOUNTER — Ambulatory Visit (INDEPENDENT_AMBULATORY_CARE_PROVIDER_SITE_OTHER): Payer: Medicare Other | Admitting: Internal Medicine

## 2018-09-16 ENCOUNTER — Other Ambulatory Visit: Payer: Self-pay

## 2018-09-16 VITALS — BP 120/80 | HR 70 | Ht 62.0 in | Wt 144.0 lb

## 2018-09-16 DIAGNOSIS — F419 Anxiety disorder, unspecified: Secondary | ICD-10-CM

## 2018-09-16 DIAGNOSIS — Z0000 Encounter for general adult medical examination without abnormal findings: Secondary | ICD-10-CM | POA: Diagnosis not present

## 2018-09-16 DIAGNOSIS — Z8669 Personal history of other diseases of the nervous system and sense organs: Secondary | ICD-10-CM

## 2018-09-16 DIAGNOSIS — M8580 Other specified disorders of bone density and structure, unspecified site: Secondary | ICD-10-CM | POA: Diagnosis not present

## 2018-09-16 DIAGNOSIS — E785 Hyperlipidemia, unspecified: Secondary | ICD-10-CM | POA: Diagnosis not present

## 2018-09-16 LAB — POCT URINALYSIS DIPSTICK
Appearance: NEGATIVE
Bilirubin, UA: NEGATIVE
Blood, UA: NEGATIVE
Glucose, UA: NEGATIVE
Ketones, UA: NEGATIVE
Leukocytes, UA: NEGATIVE
Nitrite, UA: NEGATIVE
Odor: NEGATIVE
Protein, UA: NEGATIVE
Spec Grav, UA: 1.015 (ref 1.010–1.025)
Urobilinogen, UA: 0.2 E.U./dL
pH, UA: 6.5 (ref 5.0–8.0)

## 2018-09-16 MED ORDER — MELOXICAM 15 MG PO TABS: 15.0000 mg | ORAL_TABLET | Freq: Every day | ORAL | 3 refills | Status: DC

## 2018-09-16 NOTE — Progress Notes (Signed)
   Subjective:    Patient ID: Roberta Peters, female    DOB: 1950-05-10, 68 y.o.   MRN: [id number]  HPI 68 year old Female for health maintenance exam and evaluation of medical issues. Had medicare annual wellness visit previously by virtual visit this year.  She has a history of mild anxiety, insomnia, osteopenia, history of Herpes simplex type I, GE reflux, history of C6-C7 radiculopathy.  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  Treated with a short course of prednisone for right trochanteric bursitis and did well.  History of migraine headaches treated with triptan medication.  Takes meloxicam 15 mg daily for musculoskeletal pain.  No known drug allergies  No history of serious illnesses, accidents or operations.  Total of 3 pregnancies and 1 miscarriage.  Social history: She is a widow.  Husband was [place].  He was a Education administrator.  She is an Training and development officer.  She has a Scientist, water quality.  Does not smoke.  Social alcohol consumption.  She divides her time between [place] and [place] but currently spending most of her time at her home in [place].Her 2  sons live here in [place].  Youngest son is married and has 2 children.  Family history: 2 sisters alive and well.  One sister with history of epilepsy but stable.  Parents are deceased.  Father had a fall, subsequently developed pneumonia and passed away.  He had history of kidney stones.  Brother had history of arrhythmia and pancreatic cancer.  Youngest son with history of hypertension and hyperlipidemia.    Review of Systems  Constitutional: Negative.   Respiratory: Negative.   Cardiovascular: Negative.   Gastrointestinal: Negative.   Genitourinary: Negative.   Musculoskeletal:       Musculoskeletal pain  Neurological:       History of migraine headaches  Psychiatric/Behavioral: Negative.        Objective:   Physical Exam Pleasant female in no  acute distress.  Blood pressure 120/80, pulse 70 regular.  Weight 144 pounds.  Height 5 feet 2 inches.  BMI 26.34.  Skin warm and dry.  Nodes none.  HEENT exam: TMs and pharynx are clear.  Neck is supple without JVD thyromegaly or carotid bruits.  Chest clear to auscultation.  Cardiac exam regular rate and rhythm normal S1 and S2 without murmur.  Abdomen no hepatosplenomegaly masses or tenderness.  Bimanual is normal.  Rectovaginal confirms.  Extremities without deformity or edema.  Neuro no gross focal deficits on brief neurological exam.       Assessment & Plan:  History of migraine headaches-treated with Maxalt  Musculoskeletal pain treated with meloxicam  History of anxiety treated with Xanax sparingly  Plan: Continue current medications and return in 1 year or as needed.  Have annual flu vaccine.

## 2018-09-16 NOTE — Patient Instructions (Signed)
It was a pleasure to see you today.  Continue current medications and return in 1 year or as needed. 

## 2018-09-17 LAB — COMPLETE METABOLIC PANEL WITH GFR
AG Ratio: 1.6 (calc) (ref 1.0–2.5)
ALT: 18 U/L (ref 6–29)
AST: 20 U/L (ref 10–35)
Albumin: 4.4 g/dL (ref 3.6–5.1)
Alkaline phosphatase (APISO): 57 U/L (ref 37–153)
BUN: 16 mg/dL (ref 7–25)
CO2: 24 mmol/L (ref 20–32)
Calcium: 9.8 mg/dL (ref 8.6–10.4)
Chloride: 105 mmol/L (ref 98–110)
Creat: 0.71 mg/dL (ref 0.50–0.99)
GFR, Est African American: 101 mL/min/{1.73_m2} (ref >=60)
GFR, Est Non African American: 87 mL/min/{1.73_m2} (ref >=60)
Globulin: 2.8 g/dL (calc) (ref 1.9–3.7)
Glucose, Bld: 97 mg/dL (ref 65–99)
Potassium: 4.6 mmol/L (ref 3.5–5.3)
Sodium: 142 mmol/L (ref 135–146)
Total Bilirubin: 1.1 mg/dL (ref 0.2–1.2)
Total Protein: 7.2 g/dL (ref 6.1–8.1)

## 2018-09-17 LAB — CBC WITH DIFFERENTIAL/PLATELET
Absolute Monocytes: 385 cells/uL (ref 200–950)
Basophils Absolute: 50 cells/uL (ref 0–200)
Basophils Relative: 1 %
Eosinophils Absolute: 90 cells/uL (ref 15–500)
Eosinophils Relative: 1.8 %
HCT: 42 % (ref 35.0–45.0)
Hemoglobin: 14.3 g/dL (ref 11.7–15.5)
Lymphs Abs: 1910 cells/uL (ref 850–3900)
MCH: 30.9 pg (ref 27.0–33.0)
MCHC: 34 g/dL (ref 32.0–36.0)
MCV: 90.7 fL (ref 80.0–100.0)
MPV: 10.7 fL (ref 7.5–12.5)
Monocytes Relative: 7.7 %
Neutro Abs: 2565 cells/uL (ref 1500–7800)
Neutrophils Relative %: 51.3 %
Platelets: 232 10*3/uL (ref 140–400)
RBC: 4.63 10*6/uL (ref 3.80–5.10)
RDW: 11.9 % (ref 11.0–15.0)
Total Lymphocyte: 38.2 %
WBC: 5 10*3/uL (ref 3.8–10.8)

## 2018-09-17 LAB — LIPID PANEL
Cholesterol: 244 mg/dL — ABNORMAL HIGH (ref <200)
HDL: 89 mg/dL (ref >=50)
LDL Cholesterol (Calc): 130 mg/dL (calc) — ABNORMAL HIGH
Non-HDL Cholesterol (Calc): 155 mg/dL (calc) — ABNORMAL HIGH (ref <130)
Total CHOL/HDL Ratio: 2.7 (calc) (ref <5.0)
Triglycerides: 135 mg/dL (ref <150)

## 2018-09-17 LAB — TSH: TSH: 1.82 mIU/L (ref 0.40–4.50)

## 2018-10-21 DIAGNOSIS — Z23 Encounter for immunization: Secondary | ICD-10-CM | POA: Diagnosis not present

## 2018-12-08 ENCOUNTER — Ambulatory Visit (INDEPENDENT_AMBULATORY_CARE_PROVIDER_SITE_OTHER): Payer: Medicare Other | Admitting: Internal Medicine

## 2018-12-08 ENCOUNTER — Encounter: Payer: Self-pay | Admitting: Internal Medicine

## 2018-12-08 ENCOUNTER — Other Ambulatory Visit: Payer: Self-pay

## 2018-12-08 ENCOUNTER — Telehealth: Payer: Self-pay | Admitting: Internal Medicine

## 2018-12-08 VITALS — Ht 62.0 in

## 2018-12-08 DIAGNOSIS — M25552 Pain in left hip: Secondary | ICD-10-CM | POA: Diagnosis not present

## 2018-12-08 DIAGNOSIS — M549 Dorsalgia, unspecified: Secondary | ICD-10-CM

## 2018-12-08 DIAGNOSIS — M79605 Pain in left leg: Secondary | ICD-10-CM | POA: Diagnosis not present

## 2018-12-08 MED ORDER — PREDNISONE 10 MG PO TABS: ORAL_TABLET | ORAL | 0 refills | Status: DC

## 2018-12-08 NOTE — Telephone Encounter (Signed)
Virtual visit 

## 2018-12-08 NOTE — Telephone Encounter (Signed)
Scheduled virtual visit °

## 2018-12-08 NOTE — Telephone Encounter (Signed)
Roberta Peters [phone]  [name] called to say for 2-3 weeks she has been having lower back pain and left hip pain, she would like for you to call in prednisone. I let her know she may need office visit, she stated if there had to be one she would like for it to be virtual because she lives 3 hours away.

## 2018-12-11 ENCOUNTER — Other Ambulatory Visit: Payer: Self-pay | Admitting: Internal Medicine

## 2018-12-12 NOTE — Telephone Encounter (Signed)
Refill x one year °

## 2018-12-15 NOTE — Progress Notes (Signed)
   Subjective:    Patient ID: Margot Ables, female    DOB: August 29, 1950, 68 y.o.   MRN: [id number]  HPI 68 year old Female seen today by interactive audio and video telecommunications due to the coronavirus pandemic.  She is identified using 2 identifiers as Garnet Koyanagi. Rolena Infante, a patient in this practice.  She is agreeable to visit in this format today.  She is currently living in [place].  Seen in her home today.  Patient complaining of back and hip pain.  Intermittently for several years has had issues with back and hip pain.  Sometimes is seen by nurse practitioner in [place].  Review of records indicate 2 encounters with nurse practitioner in [place] 2016 and 2018.  Also had recurrent episode in April 2019  Back pain is in left hip and buttock area as well as paralumbar area.  Has taken meloxicam in the past but prednisone seems to work better for her.  Review of Systems     Objective:   Physical Exam  She has pain in left hip and buttock is aggravating and annoying.  Straight leg raising she says is negative at 90 degrees bilaterally.  Denies weakness in the left lower extremity.       Assessment & Plan:  Acute low back strain  Plan: Patient will take prednisone in tapering course starting with 60 mg decreasing by 10 mg daily over 6 days.

## 2018-12-15 NOTE — Patient Instructions (Signed)
Take prednisone in tapering course as directed for back strain

## 2018-12-16 DIAGNOSIS — Z1159 Encounter for screening for other viral diseases: Secondary | ICD-10-CM | POA: Diagnosis not present

## 2019-04-12 ENCOUNTER — Other Ambulatory Visit: Payer: Self-pay | Admitting: Internal Medicine

## 2019-04-30 DIAGNOSIS — J019 Acute sinusitis, unspecified: Secondary | ICD-10-CM | POA: Diagnosis not present

## 2019-04-30 DIAGNOSIS — Z1231 Encounter for screening mammogram for malignant neoplasm of breast: Secondary | ICD-10-CM | POA: Diagnosis not present

## 2019-04-30 DIAGNOSIS — M810 Age-related osteoporosis without current pathological fracture: Secondary | ICD-10-CM | POA: Diagnosis not present

## 2019-04-30 DIAGNOSIS — Z13820 Encounter for screening for osteoporosis: Secondary | ICD-10-CM | POA: Diagnosis not present

## 2019-05-04 DIAGNOSIS — Z1231 Encounter for screening mammogram for malignant neoplasm of breast: Secondary | ICD-10-CM | POA: Diagnosis not present

## 2019-05-04 DIAGNOSIS — Z13820 Encounter for screening for osteoporosis: Secondary | ICD-10-CM | POA: Diagnosis not present

## 2019-05-19 DIAGNOSIS — M8580 Other specified disorders of bone density and structure, unspecified site: Secondary | ICD-10-CM | POA: Diagnosis not present

## 2019-05-19 DIAGNOSIS — I10 Essential (primary) hypertension: Secondary | ICD-10-CM | POA: Diagnosis not present

## 2019-08-20 ENCOUNTER — Other Ambulatory Visit: Payer: Self-pay

## 2019-08-20 MED ORDER — ALPRAZOLAM 0.5 MG PO TABS: ORAL_TABLET | ORAL | 0 refills | Status: AC

## 2019-08-20 MED ORDER — SUMATRIPTAN SUCCINATE 50 MG PO TABS: ORAL_TABLET | ORAL | 0 refills | Status: DC

## 2019-09-07 ENCOUNTER — Other Ambulatory Visit: Payer: Self-pay | Admitting: Internal Medicine

## 2019-09-17 ENCOUNTER — Encounter: Payer: Medicare Other | Admitting: Internal Medicine

## 2019-10-07 DIAGNOSIS — J019 Acute sinusitis, unspecified: Secondary | ICD-10-CM | POA: Diagnosis not present

## 2019-10-07 DIAGNOSIS — H6503 Acute serous otitis media, bilateral: Secondary | ICD-10-CM | POA: Diagnosis not present

## 2019-10-30 DIAGNOSIS — Z23 Encounter for immunization: Secondary | ICD-10-CM | POA: Diagnosis not present

## 2019-11-16 ENCOUNTER — Ambulatory Visit (INDEPENDENT_AMBULATORY_CARE_PROVIDER_SITE_OTHER): Payer: Medicare Other | Admitting: Internal Medicine

## 2019-11-16 ENCOUNTER — Other Ambulatory Visit: Payer: Self-pay

## 2019-11-16 VITALS — BP 110/80 | HR 76 | Ht 61.5 in | Wt 145.0 lb

## 2019-11-16 DIAGNOSIS — M8580 Other specified disorders of bone density and structure, unspecified site: Secondary | ICD-10-CM

## 2019-11-16 DIAGNOSIS — E7800 Pure hypercholesterolemia, unspecified: Secondary | ICD-10-CM | POA: Diagnosis not present

## 2019-11-16 DIAGNOSIS — Z8669 Personal history of other diseases of the nervous system and sense organs: Secondary | ICD-10-CM

## 2019-11-16 DIAGNOSIS — M7918 Myalgia, other site: Secondary | ICD-10-CM | POA: Diagnosis not present

## 2019-11-16 DIAGNOSIS — Z0000 Encounter for general adult medical examination without abnormal findings: Secondary | ICD-10-CM

## 2019-11-16 DIAGNOSIS — G43019 Migraine without aura, intractable, without status migrainosus: Secondary | ICD-10-CM | POA: Diagnosis not present

## 2019-11-16 DIAGNOSIS — G43009 Migraine without aura, not intractable, without status migrainosus: Secondary | ICD-10-CM

## 2019-11-16 DIAGNOSIS — Z1329 Encounter for screening for other suspected endocrine disorder: Secondary | ICD-10-CM | POA: Diagnosis not present

## 2019-11-16 DIAGNOSIS — F411 Generalized anxiety disorder: Secondary | ICD-10-CM

## 2019-11-16 DIAGNOSIS — E785 Hyperlipidemia, unspecified: Secondary | ICD-10-CM | POA: Diagnosis not present

## 2019-11-16 LAB — CBC WITH DIFFERENTIAL/PLATELET
Absolute Monocytes: 490 cells/uL (ref 200–950)
Basophils Absolute: 70 cells/uL (ref 0–200)
Basophils Relative: 1 %
Eosinophils Absolute: 98 cells/uL (ref 15–500)
Eosinophils Relative: 1.4 %
HCT: 44.2 % (ref 35.0–45.0)
Hemoglobin: 15.1 g/dL (ref 11.7–15.5)
Lymphs Abs: 2534 cells/uL (ref 850–3900)
MCH: 30.9 pg (ref 27.0–33.0)
MCHC: 34.2 g/dL (ref 32.0–36.0)
MCV: 90.6 fL (ref 80.0–100.0)
MPV: 10.4 fL (ref 7.5–12.5)
Monocytes Relative: 7 %
Neutro Abs: 3808 cells/uL (ref 1500–7800)
Neutrophils Relative %: 54.4 %
Platelets: 324 10*3/uL (ref 140–400)
RBC: 4.88 10*6/uL (ref 3.80–5.10)
RDW: 12.1 % (ref 11.0–15.0)
Total Lymphocyte: 36.2 %
WBC: 7 10*3/uL (ref 3.8–10.8)

## 2019-11-16 LAB — COMPLETE METABOLIC PANEL WITH GFR
AG Ratio: 1.6 (calc) (ref 1.0–2.5)
ALT: 38 U/L — ABNORMAL HIGH (ref 6–29)
AST: 28 U/L (ref 10–35)
Albumin: 4.6 g/dL (ref 3.6–5.1)
Alkaline phosphatase (APISO): 98 U/L (ref 37–153)
BUN: 10 mg/dL (ref 7–25)
CO2: 27 mmol/L (ref 20–32)
Calcium: 9.8 mg/dL (ref 8.6–10.4)
Chloride: 102 mmol/L (ref 98–110)
Creat: 0.72 mg/dL (ref 0.60–0.93)
GFR, Est African American: 98 mL/min/{1.73_m2} (ref >=60)
GFR, Est Non African American: 85 mL/min/{1.73_m2} (ref >=60)
Globulin: 2.8 g/dL (calc) (ref 1.9–3.7)
Glucose, Bld: 96 mg/dL (ref 65–99)
Potassium: 3.9 mmol/L (ref 3.5–5.3)
Sodium: 139 mmol/L (ref 135–146)
Total Bilirubin: 0.9 mg/dL (ref 0.2–1.2)
Total Protein: 7.4 g/dL (ref 6.1–8.1)

## 2019-11-16 LAB — POCT URINALYSIS DIPSTICK
Appearance: NEGATIVE
Bilirubin, UA: NEGATIVE
Blood, UA: NEGATIVE
Glucose, UA: NEGATIVE
Ketones, UA: NEGATIVE
Leukocytes, UA: NEGATIVE
Nitrite, UA: NEGATIVE
Odor: NEGATIVE
Protein, UA: NEGATIVE
Spec Grav, UA: 1.01 (ref 1.010–1.025)
Urobilinogen, UA: 0.2 E.U./dL
pH, UA: 6.5 (ref 5.0–8.0)

## 2019-11-16 LAB — LIPID PANEL
Cholesterol: 227 mg/dL — ABNORMAL HIGH (ref <200)
HDL: 78 mg/dL (ref >=50)
LDL Cholesterol (Calc): 123 mg/dL (calc) — ABNORMAL HIGH
Non-HDL Cholesterol (Calc): 149 mg/dL (calc) — ABNORMAL HIGH (ref <130)
Total CHOL/HDL Ratio: 2.9 (calc) (ref <5.0)
Triglycerides: 147 mg/dL (ref <150)

## 2019-11-16 LAB — TSH: TSH: 2.46 mIU/L (ref 0.40–4.50)

## 2019-11-16 MED ORDER — PREDNISONE 10 MG PO TABS
ORAL_TABLET | ORAL | 1 refills | Status: AC
Start: 2019-11-16 — End: ?

## 2019-11-16 MED ORDER — SUMATRIPTAN SUCCINATE 50 MG PO TABS: ORAL_TABLET | ORAL | 99 refills | Status: AC

## 2019-11-16 MED ORDER — OXYCODONE-ACETAMINOPHEN 10-325 MG PO TABS: 1.0000 | ORAL_TABLET | Freq: Three times a day (TID) | ORAL | 0 refills | Status: AC | PRN

## 2019-11-16 MED ORDER — SUMATRIPTAN SUCCINATE 50 MG PO TABS: ORAL_TABLET | ORAL | 99 refills | Status: DC

## 2019-11-16 NOTE — Patient Instructions (Addendum)
Oxycodone APAP to take with severe migraine q 8 hours sparingly( #15 tabs). Refill Prednisone taper for protracted migraine and Imitrex. Fasting labs drawn and pending.  Xanax to take sparingly for anxiety.  Have annual mammogram.  COVID-19 immunizations are up-to-date x3.  Flu vaccine given.  Return in 1 year or as needed.

## 2019-11-16 NOTE — Progress Notes (Signed)
Subjective:    Patient ID: Roberta Peters, female    DOB: 01-23-1951, 69 y.o.   MRN: [id number]  HPI 69 year old Female for health maintenance, Medicare wellness and evaluation of medical issues.  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  Treated with short course of prednisone for right trochanteric bursitis and did well.  History of migraine headaches treated with triptan medication.  Takes meloxicam for musculoskeletal pain.  No known drug allergies  No history of serious illnesses accidents or operations.  Total of 3 pregnancies and 1 miscarriage.  Social history: She is a widow.  Husband was CEO of the [employer].  He was a Teacher, music.  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  She is living mostly in [place] but comes to [place] to visit her son.  Her other son, Marylu Lund is getting married in [place] soon.  Youngest son is married and has 2 children.  Family history: 2 sisters alive and well.  1 sister with history of epilepsy but stable.  Parents are deceased.  Father had a fall, developed pneumonia and passed away.  He had a history of kidney stones.  Brother had history of arrhythmia and pancreatic cancer.  Youngest son with history of hypertension and hyperlipidemia.  Flu vaccine given today.   Review of Systems Has been getting more frequent migraines recently-she is not sure exactly why.  Has done well during the pandemic.  Has had 3 COVID-19 immunizations with Moderna vaccine    Objective:   Physical Exam  Blood pressure 110/80 pulse 76 pulse oximetry 97% BMI 26.95 height 5 feet 1.5 inches  Skin warm and dry.  Nodes none.  TMs are clear.  Neck is supple.  No thyromegaly.  No carotid bruits.  Chest is clear to auscultation.  Cardiac exam  regular rate and rhythm.  Abdomen soft nondistended without hepatosplenomegaly masses or tenderness.  No lower extremity pitting edema.  Neuro intact without focal deficits.  Affect felt judgment are normal.      Assessment & Plan:  History of migraine headaches-not sure what triggers these but gets relief with triptan medication.  She is going to a wedding for her son in [place].  Have given her some narcotic pain medication to take with her on the trip oxycodone/APAP should she have a severe migraine.  Have treated her with a tapering course of prednisone to help eliminate migraine cycle she is currently in.  Refill Imitrex on a as needed basis.  May continue with meloxicam for musculoskeletal pain.  May take Xanax 0.5 mg up to 3 times daily as needed for anxiety.  Mild elevation of SGPT-watch wine consumption.  Level is 38.  It was normal last year.  Return in 1 year or as needed.  Subjective:   Patient presents for Medicare Annual/Subsequent preventive examination.  Review Past Medical/Family/Social: See above   Risk Factors  Current exercise habits: Works in her yard and walks Dietary issues discussed: Low-fat low carbohydrate  Cardiac risk factors: Pure hypercholesterolemia  Depression Screen  (Note: if answer to either of the following is "Yes", a more complete depression screening is indicated)   Over the past two weeks, have you felt down, depressed or hopeless? No  Over the past two weeks, have  you felt little interest or pleasure in doing things? No Have you lost interest or pleasure in daily life? No Do you often feel hopeless? No Do you cry easily over simple problems? No   Activities of Daily Living  In your present state of health, do you have any difficulty performing the following activities?:   Driving? No  Managing money? No  Feeding yourself? No  Getting from bed to chair? No  Climbing a flight of stairs? No  Preparing food and eating?: No  Bathing  or showering? No  Getting dressed: No  Getting to the toilet? No  Using the toilet:No  Moving around from place to place: No  In the past year have you fallen or had a near fall?:No  Are you sexually active? No  Do you have more than one partner? No   Hearing Difficulties: No  Do you often ask people to speak up or repeat themselves? No  Do you experience ringing or noises in your ears? No  Do you have difficulty understanding soft or whispered voices? No  Do you feel that you have a problem with memory? No Do you often misplace items? No    Home Safety:  Do you have a smoke alarm at your residence? Yes Do you have grab bars in the bathroom?  No Do you have throw rugs in your house?  No  Cognitive Testing  Alert? Yes Normal Appearance?Yes  Oriented to person? Yes Place? Yes  Time? Yes  Recall of three objects? Yes  Can perform simple calculations? Yes  Displays appropriate judgment?Yes  Can read the correct time from a watch face?Yes   List the Names of Other Physician/Practitioners you currently use:  See referral list for the physicians patient is currently seeing.     Review of Systems: See above   Objective:     General appearance: Appears stated age and in no acute distress Head: Normocephalic, without obvious abnormality, atraumatic  Eyes: conj clear, EOMi PEERLA  Ears: normal TM's and external ear canals both ears  Nose: Nares normal. Septum midline. Mucosa normal. No drainage or sinus tenderness.  Throat: lips, mucosa, and tongue normal; teeth and gums normal  Neck: no adenopathy, no carotid bruit, no JVD, supple, symmetrical, trachea midline and thyroid not enlarged, symmetric, no tenderness/mass/nodules  No CVA tenderness.  Lungs: clear to auscultation bilaterally  Breasts: normal appearance, no masses or tenderness, Heart: regular rate and rhythm, S1, S2 normal, no murmur, click, rub or gallop  Abdomen: soft, non-tender; bowel sounds normal; no masses,  no organomegaly  Musculoskeletal: ROM normal in all joints, no crepitus, no deformity, Normal muscle strengthen. Back  is symmetric, no curvature. Skin: Skin color, texture, turgor normal. No rashes or lesions  Lymph nodes: Cervical, supraclavicular, and axillary nodes normal.  Neurologic: CN 2 -12 Normal, Normal symmetric reflexes. Normal coordination and gait  Psych: Alert & Oriented x 3, Mood appear stable.    Assessment:    Annual wellness medicare exam   Plan:    During the course of the visit the patient was educated and counseled about appropriate screening and preventive services including:   COVID-19 immunizations are up-to-date x3  Flu vaccine given.  Tetanus immunizations up-to-date Have annual mammogram.  Colonoscopy is up-to-date.  This was done in [place].    Patient Instructions (the written plan) was given to the patient.  Medicare Attestation  I have personally reviewed:  The patient's medical and social history  Their use of alcohol, tobacco  or illicit drugs  Their current medications and supplements  The patient's functional ability including ADLs,fall risks, home safety risks, cognitive, and hearing and visual impairment  Diet and physical activities  Evidence for depression or mood disorders  The patient's weight, height, BMI, and visual acuity have been recorded in the chart. I have made referrals, counseling, and provided education to the patient based on review of the above and I have provided the patient with a written personalized care plan for preventive services.

## 2019-11-18 ENCOUNTER — Encounter: Payer: Self-pay | Admitting: Internal Medicine

## 2019-12-30 DIAGNOSIS — M545 Low back pain, unspecified: Secondary | ICD-10-CM | POA: Diagnosis not present

## 2019-12-30 DIAGNOSIS — M25559 Pain in unspecified hip: Secondary | ICD-10-CM | POA: Diagnosis not present

## 2020-05-05 ENCOUNTER — Telehealth: Payer: Medicare Other | Admitting: Internal Medicine

## 2020-05-05 ENCOUNTER — Telehealth: Payer: Self-pay | Admitting: Internal Medicine

## 2020-05-05 NOTE — Telephone Encounter (Signed)
Virtual Visit 

## 2020-05-05 NOTE — Telephone Encounter (Signed)
Virtual visit 

## 2020-05-05 NOTE — Telephone Encounter (Signed)
scheduled

## 2020-05-05 NOTE — Telephone Encounter (Signed)
Roberta Peters [phone]  Harriett Sine called to say she thinks she has sinus infection that started at least a week or so ago. Headache that centers around eyes, face is sore, runny nose, cough, whitish, greenish mucus, no fever, has had COVID Vaccines and booster. She done at home COVID test 2 days ago and again last night that was negative. She does not want to come in since she lives so far away, would like for something to be called in. I let her know there would at least have to be a video visit. No think could be called in without you talking with her.

## 2023-09-06 ENCOUNTER — Encounter: Payer: Self-pay | Admitting: Advanced Practice Midwife
